# Patient Record
Sex: Male | Born: 1950 | Marital: Single | Smoking: Current every day smoker
Health system: Southern US, Community
[De-identification: ages and names within clinical notes are randomized; demographics above are authoritative.]

## PROBLEM LIST (undated history)

## (undated) DIAGNOSIS — I10 Essential (primary) hypertension: Secondary | ICD-10-CM

## (undated) DIAGNOSIS — G459 Transient cerebral ischemic attack, unspecified: Secondary | ICD-10-CM

---

## 2019-08-23 ENCOUNTER — Other Ambulatory Visit (HOSPITAL_COMMUNITY): Payer: Self-pay | Admitting: Neurology

## 2019-08-23 ENCOUNTER — Other Ambulatory Visit: Payer: Self-pay | Admitting: Neurology

## 2019-08-23 DIAGNOSIS — R413 Other amnesia: Secondary | ICD-10-CM

## 2019-08-23 DIAGNOSIS — G309 Alzheimer's disease, unspecified: Secondary | ICD-10-CM

## 2019-09-07 ENCOUNTER — Ambulatory Visit (HOSPITAL_COMMUNITY): Admission: RE | Admit: 2019-09-07 | Payer: Medicare PPO | Source: Ambulatory Visit

## 2019-09-15 ENCOUNTER — Ambulatory Visit (HOSPITAL_COMMUNITY)
Admission: RE | Admit: 2019-09-15 | Discharge: 2019-09-15 | Disposition: A | Discharge status: Home / Self Care | Payer: Medicare PPO | Source: Ambulatory Visit | Attending: Neurology | Admitting: Neurology

## 2019-09-15 ENCOUNTER — Other Ambulatory Visit: Payer: Self-pay

## 2019-09-15 DIAGNOSIS — F0280 Dementia in other diseases classified elsewhere without behavioral disturbance: Secondary | ICD-10-CM | POA: Insufficient documentation

## 2019-09-15 DIAGNOSIS — R413 Other amnesia: Secondary | ICD-10-CM | POA: Insufficient documentation

## 2019-09-15 DIAGNOSIS — G309 Alzheimer's disease, unspecified: Secondary | ICD-10-CM | POA: Insufficient documentation

## 2019-09-15 DIAGNOSIS — F0150 Vascular dementia without behavioral disturbance: Secondary | ICD-10-CM | POA: Insufficient documentation

## 2019-09-15 IMAGING — MR MR HEAD W/O CM
14 series · 44 of 48 positions shown · non-contrast
Comparison: None.

CLINICAL DATA: Memory loss

EXAM:
MRI HEAD WITHOUT CONTRAST
TECHNIQUE: Multiplanar, multiecho pulse sequences of the brain and surrounding
structures were obtained without intravenous contrast.
Additionally, using NeuroQuant software a 3D volumetric analysis of
the brain was performed and is compared to a normative database
adjusted for age, gender and intracranial volume.

[Series 2: T1 · sagittal · 1.2mm · 0.94mm/px · 5 of 143 slices shown]
[im 1/143]
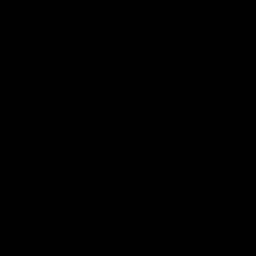
[im 36/143]
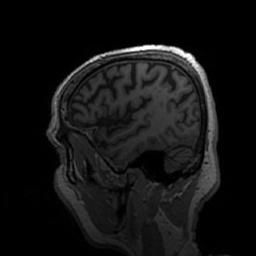
[im 72/143]
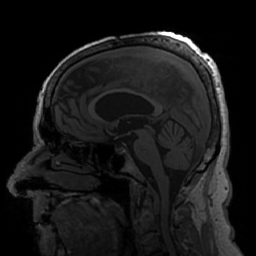
[im 107/143]
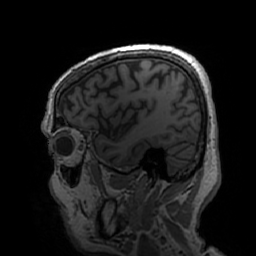
[im 143/143]
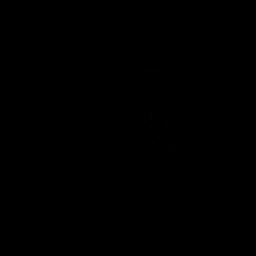

[Series 3: DWI · axial · 3.0mm · 0.94mm/px · z∈[-97,+56]mm · 4 of 104 slices shown (1 of 2)]
[im 1/104]
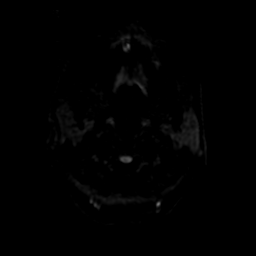
[im 35/104]
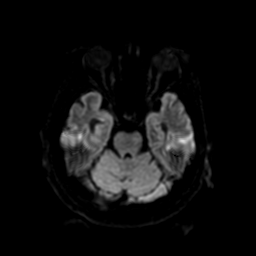
[im 69/104]
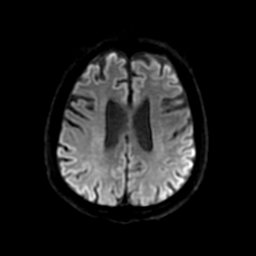
[im 104/104]
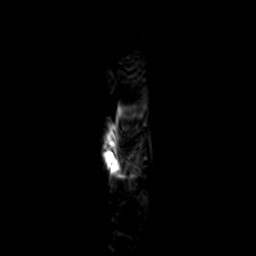

[Series 4: DWI · coronal · 4.0mm · 0.94mm/px · 3 of 74 slices shown (2 of 2)]
[im 1/74]
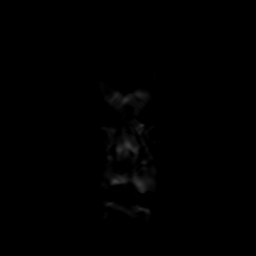
[im 37/74]
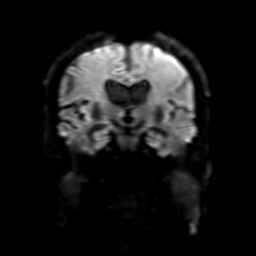
[im 74/74]
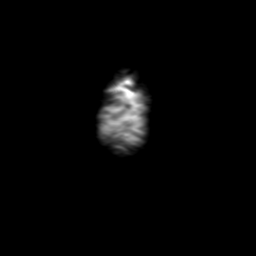

[Series 5: FLAIR · sagittal · 5.0mm · 0.47mm/px · 1 of 25 slices shown (1 of 2)]
[im 1/25]
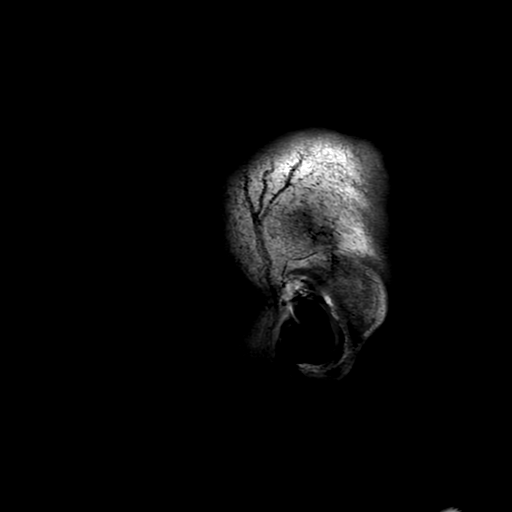

[Series 6: T2 · axial · 5.0mm · 0.47mm/px · 1 of 25 slices shown (1 of 2)]
[im 1/25]
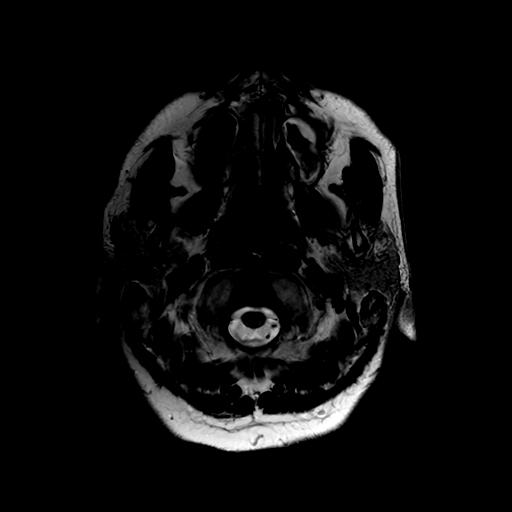

[Series 7: FLAIR · axial · 3.0mm · 0.41mm/px · 1 of 25 slices shown (2 of 2)]
[im 1/25]
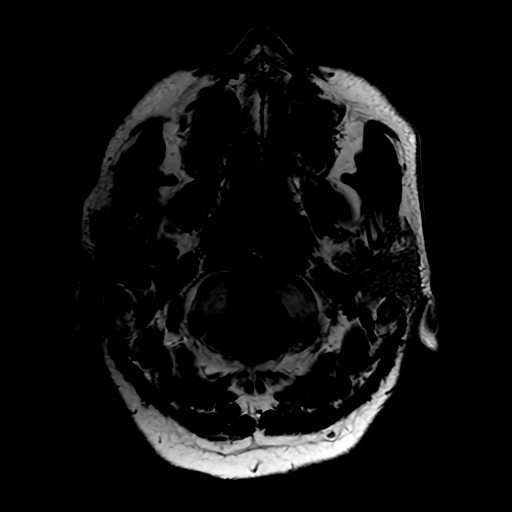

[Series 8: (person_name) · axial · 3.0mm · 0.47mm/px · z∈[-95,+52]mm · 4 of 100 slices shown]
[im 1/100]
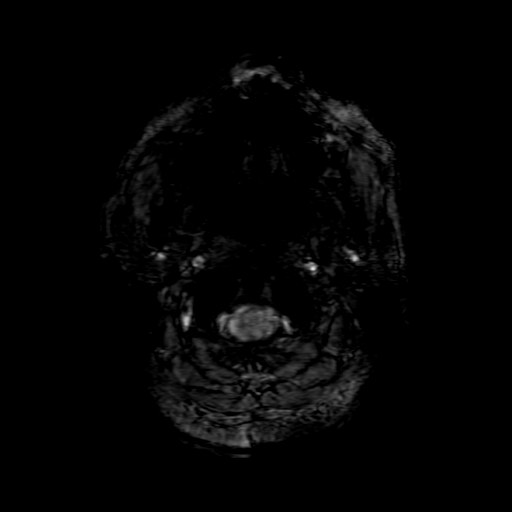
[im 34/100]
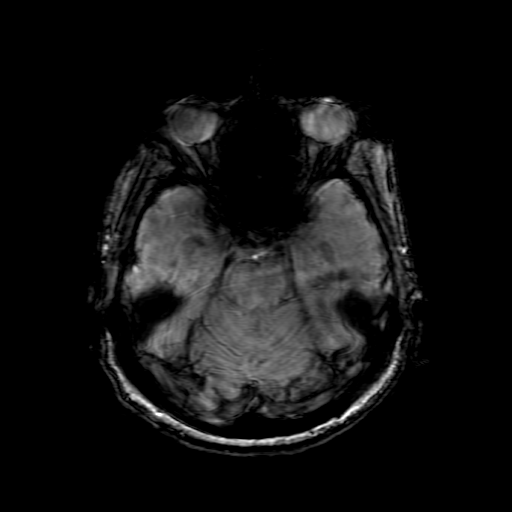
[im 67/100]
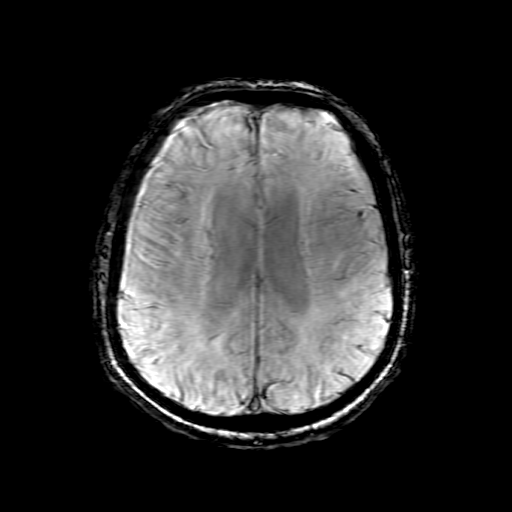
[im 100/100]
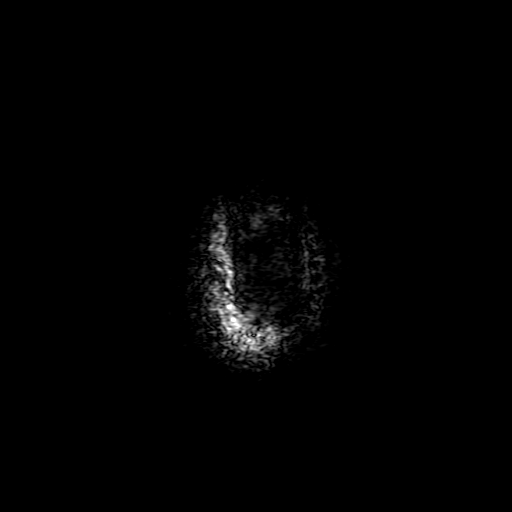

[Series 9: ax 3(person_name) pre · axial · non-contrast · 3.0mm · 0.94mm/px · z∈[-95,+51]mm · 2 of 50 slices shown]
[im 1/50]
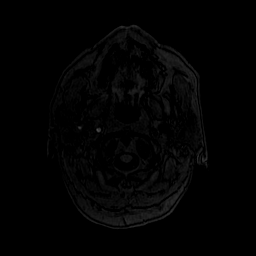
[im 50/50]
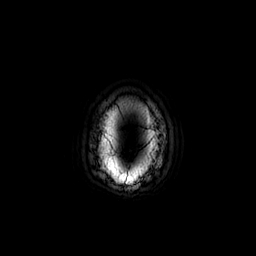

[Series 10: T2 · coronal · 5.0mm · 0.39mm/px · 1 of 30 slices shown (2 of 2)]
[im 1/30]
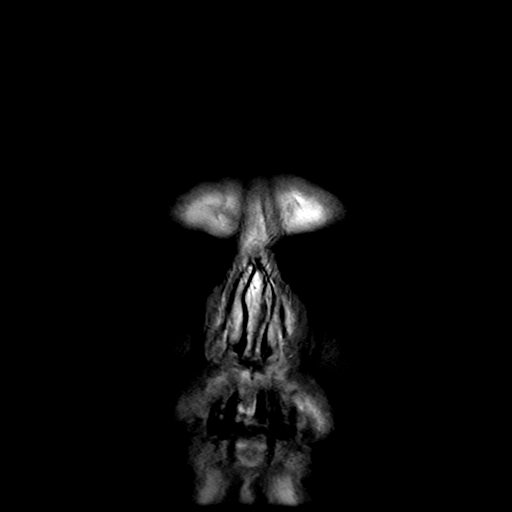

[Series 52: nqsegcb_sc_cor · 1.00mm/px · 8 of 229 slices shown]
[im 1/229]
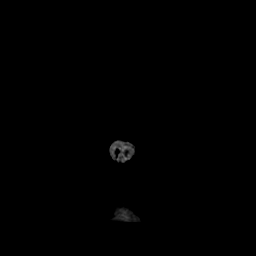
[im 33/229]
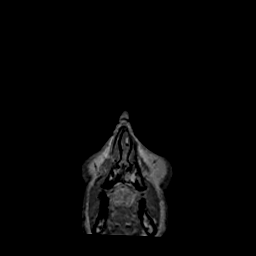
[im 66/229]
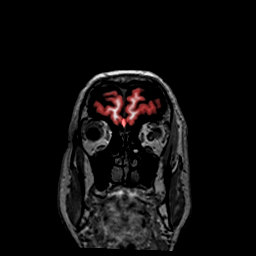
[im 98/229]
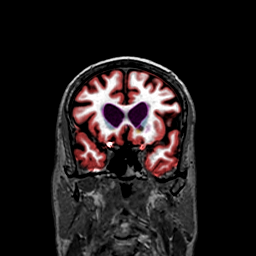
[im 131/229]
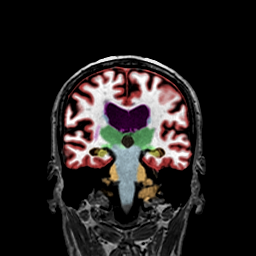
[im 163/229]
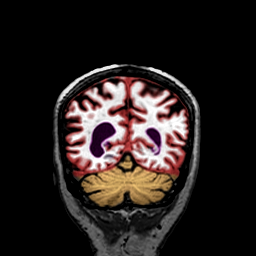
[im 196/229]
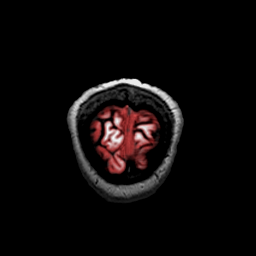
[im 229/229]
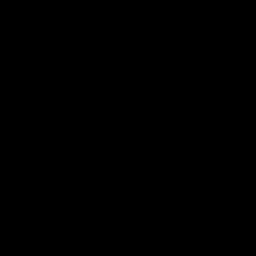

[Series 53: nqsegcb_sc_axl · 1.00mm/px · 8 of 217 slices shown]
[im 1/217]
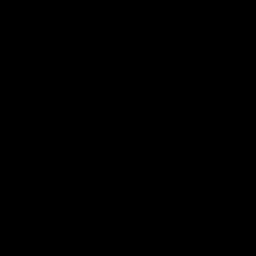
[im 31/217]
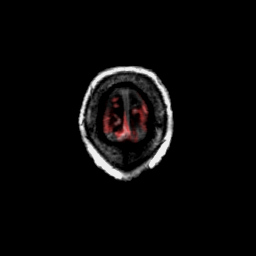
[im 62/217]
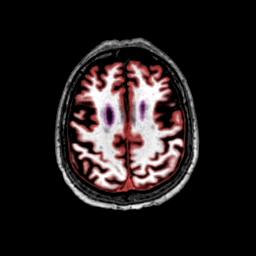
[im 93/217]
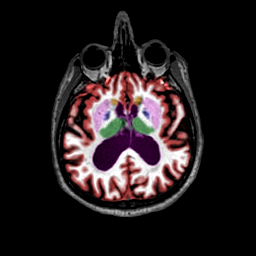
[im 124/217]
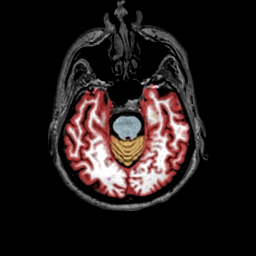
[im 155/217]
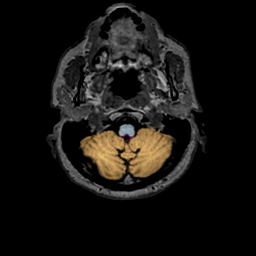
[im 186/217]
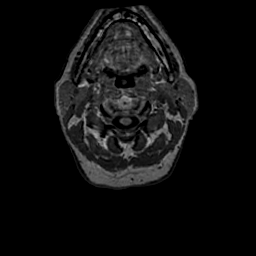
[im 217/217]
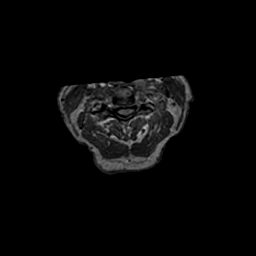

[Series 54: nqsegcb_sc_sag · 1.00mm/px · 3 of 184 slices shown]
[im 1/184]
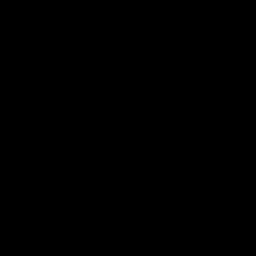
[im 31/184]
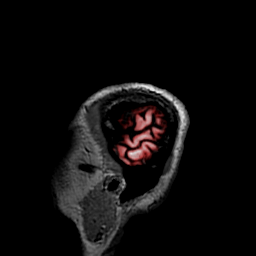
[im 62/184]
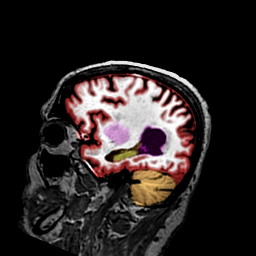

[Series 350: ADC · axial · 3.0mm · 0.94mm/px · z∈[-97,+56]mm · 2 of 52 slices shown (1 of 2)]
[im 1/52]
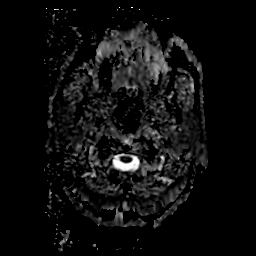
[im 52/52]
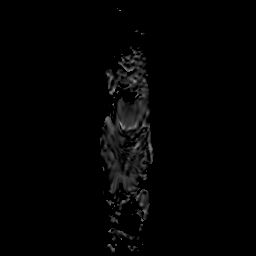

[Series 450: ADC · coronal · 4.0mm · 0.94mm/px · 1 of 37 slices shown (2 of 2)]
[im 1/37]
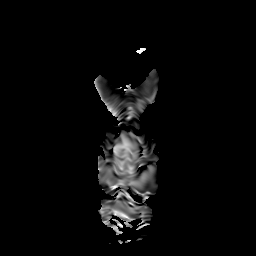

[44 of 48 positions shown; findings below may reference images not displayed]

FINDINGS: NeuroQuant Findings:

Volumetric analysis of the brain was performed, with a fully
detailed report in [HOSPITAL] PACS. Briefly, the comparison with age and
gender matched reference reveals age accelerated volume loss.

BRAIN: No acute infarct, acute hemorrhage or extra-axial collection.
Multifocal white matter hyperintensity, most commonly due to chronic
ischemic microangiopathy. There are old small vessel infarcts of the
basal ganglia bilaterally. Advanced atrophy for age. No chronic
microhemorrhage. Normal midline structures.

VASCULAR: Major flow voids are preserved.

SKULL AND UPPER CERVICAL SPINE: Normal calvarium and skull base.
Visualized upper cervical spine and soft tissues are normal.

SINUSES/ORBITS: No paranasal sinus fluid levels or advanced mucosal
thickening. No mastoid or middle ear effusion. Normal orbits.
IMPRESSION: 1. No acute intracranial abnormality.
2. Generalized atrophy greater than expected for age.
3. Old small vessel infarcts of the basal ganglia bilaterally.
4. NeuroQuant volumetric analysis of the brain, see details on
[HOSPITAL] PACS.

## 2021-02-07 ENCOUNTER — Other Ambulatory Visit: Payer: Self-pay

## 2021-02-07 ENCOUNTER — Emergency Department
Admission: EM | Admit: 2021-02-07 | Discharge: 2021-02-07 | Disposition: A | Discharge status: Other Acute Inpatient Hospital | Payer: Medicare PPO | Attending: Emergency Medicine | Admitting: Emergency Medicine

## 2021-02-07 ENCOUNTER — Emergency Department (HOSPITAL_COMMUNITY): Payer: Medicare PPO

## 2021-02-07 ENCOUNTER — Emergency Department: Payer: Medicare PPO

## 2021-02-07 ENCOUNTER — Encounter: Payer: Self-pay | Admitting: Radiology

## 2021-02-07 ENCOUNTER — Encounter (HOSPITAL_COMMUNITY): Payer: Self-pay

## 2021-02-07 ENCOUNTER — Emergency Department
Admission: EM | Admit: 2021-02-07 | Discharge: 2021-02-07 | Discharge status: Absent without leave | Payer: Medicare PPO | Source: Home / Self Care

## 2021-02-07 ENCOUNTER — Observation Stay (HOSPITAL_COMMUNITY)
Admission: EM | Admit: 2021-02-07 | Discharge: 2021-02-09 | Disposition: A | Discharge status: Home / Self Care | Payer: Medicare PPO | Attending: Family Medicine | Admitting: Family Medicine

## 2021-02-07 DIAGNOSIS — G9340 Encephalopathy, unspecified: Secondary | ICD-10-CM | POA: Diagnosis present

## 2021-02-07 DIAGNOSIS — R4182 Altered mental status, unspecified: Secondary | ICD-10-CM

## 2021-02-07 DIAGNOSIS — R4701 Aphasia: Secondary | ICD-10-CM

## 2021-02-07 DIAGNOSIS — I639 Cerebral infarction, unspecified: Secondary | ICD-10-CM

## 2021-02-07 DIAGNOSIS — Z7901 Long term (current) use of anticoagulants: Secondary | ICD-10-CM | POA: Insufficient documentation

## 2021-02-07 DIAGNOSIS — I69951 Hemiplegia and hemiparesis following unspecified cerebrovascular disease affecting right dominant side: Secondary | ICD-10-CM | POA: Diagnosis not present

## 2021-02-07 DIAGNOSIS — I161 Hypertensive emergency: Secondary | ICD-10-CM

## 2021-02-07 DIAGNOSIS — Z20822 Contact with and (suspected) exposure to covid-19: Secondary | ICD-10-CM | POA: Insufficient documentation

## 2021-02-07 DIAGNOSIS — Z720 Tobacco use: Secondary | ICD-10-CM

## 2021-02-07 DIAGNOSIS — Z7982 Long term (current) use of aspirin: Secondary | ICD-10-CM | POA: Insufficient documentation

## 2021-02-07 DIAGNOSIS — I10 Essential (primary) hypertension: Secondary | ICD-10-CM | POA: Insufficient documentation

## 2021-02-07 DIAGNOSIS — I6522 Occlusion and stenosis of left carotid artery: Secondary | ICD-10-CM

## 2021-02-07 DIAGNOSIS — I69320 Aphasia following cerebral infarction: Secondary | ICD-10-CM | POA: Diagnosis not present

## 2021-02-07 DIAGNOSIS — F17210 Nicotine dependence, cigarettes, uncomplicated: Secondary | ICD-10-CM | POA: Diagnosis not present

## 2021-02-07 DIAGNOSIS — Z7902 Long term (current) use of antithrombotics/antiplatelets: Secondary | ICD-10-CM | POA: Insufficient documentation

## 2021-02-07 DIAGNOSIS — F03918 Unspecified dementia, unspecified severity, with other behavioral disturbance: Secondary | ICD-10-CM

## 2021-02-07 DIAGNOSIS — I6523 Occlusion and stenosis of bilateral carotid arteries: Secondary | ICD-10-CM | POA: Insufficient documentation

## 2021-02-07 DIAGNOSIS — I6381 Other cerebral infarction due to occlusion or stenosis of small artery: Secondary | ICD-10-CM | POA: Diagnosis not present

## 2021-02-07 DIAGNOSIS — R2681 Unsteadiness on feet: Secondary | ICD-10-CM | POA: Diagnosis not present

## 2021-02-07 DIAGNOSIS — Z131 Encounter for screening for diabetes mellitus: Secondary | ICD-10-CM | POA: Diagnosis not present

## 2021-02-07 DIAGNOSIS — Z8673 Personal history of transient ischemic attack (TIA), and cerebral infarction without residual deficits: Secondary | ICD-10-CM | POA: Insufficient documentation

## 2021-02-07 DIAGNOSIS — F0280 Dementia in other diseases classified elsewhere without behavioral disturbance: Secondary | ICD-10-CM

## 2021-02-07 DIAGNOSIS — G300 Alzheimer's disease with early onset: Secondary | ICD-10-CM

## 2021-02-07 DIAGNOSIS — R410 Disorientation, unspecified: Secondary | ICD-10-CM | POA: Diagnosis present

## 2021-02-07 DIAGNOSIS — G459 Transient cerebral ischemic attack, unspecified: Secondary | ICD-10-CM

## 2021-02-07 HISTORY — DX: Essential (primary) hypertension: I10

## 2021-02-07 HISTORY — DX: Transient cerebral ischemic attack, unspecified: G45.9

## 2021-02-07 LAB — PROTIME-INR
INR: 0.9 (ref 0.8–1.2)
INR: 0.9 (ref 0.8–1.2)
Prothrombin Time: 12.6 seconds (ref 11.4–15.2)
Prothrombin Time: 12.2 seconds (ref 11.4–15.2)

## 2021-02-07 LAB — CBC WITH DIFFERENTIAL/PLATELET
Abs Immature Granulocytes: 0.02 10*3/uL (ref 0.00–0.07)
Basophils Absolute: 0 10*3/uL (ref 0.0–0.1)
Basophils Relative: 1 %
Eosinophils Absolute: 0 10*3/uL (ref 0.0–0.5)
Eosinophils Relative: 0 %
HCT: 48.1 % (ref 39.0–52.0)
Hemoglobin: 16.9 g/dL (ref 13.0–17.0)
Immature Granulocytes: 0 %
Lymphocytes Relative: 12 %
Lymphs Abs: 0.9 10*3/uL (ref 0.7–4.0)
MCH: 32.3 pg (ref 26.0–34.0)
MCHC: 35.1 g/dL (ref 30.0–36.0)
MCV: 92 fL (ref 80.0–100.0)
Monocytes Absolute: 0.4 10*3/uL (ref 0.1–1.0)
Monocytes Relative: 5 %
Neutro Abs: 6.6 10*3/uL (ref 1.7–7.7)
Neutrophils Relative %: 82 %
Platelets: 260 10*3/uL (ref 150–400)
RBC: 5.23 MIL/uL (ref 4.22–5.81)
RDW: 11.9 % (ref 11.5–15.5)
WBC: 8 10*3/uL (ref 4.0–10.5)
nRBC: 0 % (ref 0.0–0.2)

## 2021-02-07 LAB — URINALYSIS, ROUTINE W REFLEX MICROSCOPIC
Bilirubin Urine: NEGATIVE
Glucose, UA: NEGATIVE mg/dL
Ketones, ur: NEGATIVE mg/dL
Leukocytes,Ua: NEGATIVE
Nitrite: NEGATIVE
Protein, ur: NEGATIVE mg/dL
Specific Gravity, Urine: 1.02 (ref 1.005–1.030)
pH: 6.5 (ref 5.0–8.0)

## 2021-02-07 LAB — DIFFERENTIAL
Abs Immature Granulocytes: 0.02 10*3/uL (ref 0.00–0.07)
Basophils Absolute: 0.1 10*3/uL (ref 0.0–0.1)
Basophils Relative: 1 %
Eosinophils Absolute: 0.1 10*3/uL (ref 0.0–0.5)
Eosinophils Relative: 1 %
Immature Granulocytes: 0 %
Lymphocytes Relative: 15 %
Lymphs Abs: 0.9 10*3/uL (ref 0.7–4.0)
Monocytes Absolute: 0.4 10*3/uL (ref 0.1–1.0)
Monocytes Relative: 7 %
Neutro Abs: 4.3 10*3/uL (ref 1.7–7.7)
Neutrophils Relative %: 76 %

## 2021-02-07 LAB — COMPREHENSIVE METABOLIC PANEL
ALT: 10 U/L (ref 0–44)
ALT: 13 U/L (ref 0–44)
AST: 17 U/L (ref 15–41)
AST: 16 U/L (ref 15–41)
Albumin: 3.8 g/dL (ref 3.5–5.0)
Albumin: 3.7 g/dL (ref 3.5–5.0)
Alkaline Phosphatase: 104 U/L (ref 38–126)
Alkaline Phosphatase: 97 U/L (ref 38–126)
Anion gap: 9 (ref 5–15)
Anion gap: 13 (ref 5–15)
BUN: 10 mg/dL (ref 8–23)
BUN: 8 mg/dL (ref 8–23)
CO2: 26 mmol/L (ref 22–32)
CO2: 23 mmol/L (ref 22–32)
Calcium: 9.4 mg/dL (ref 8.9–10.3)
Calcium: 9.2 mg/dL (ref 8.9–10.3)
Chloride: 99 mmol/L (ref 98–111)
Chloride: 99 mmol/L (ref 98–111)
Creatinine, Ser: 0.88 mg/dL (ref 0.61–1.24)
Creatinine, Ser: 0.97 mg/dL (ref 0.61–1.24)
GFR, Estimated: >60 mL/min (ref >60)
GFR, Estimated: >60 mL/min (ref >60)
Glucose, Bld: 102 mg/dL — ABNORMAL HIGH (ref 70–99)
Glucose, Bld: 103 mg/dL — ABNORMAL HIGH (ref 70–99)
Potassium: 4.2 mmol/L (ref 3.5–5.1)
Potassium: 3.8 mmol/L (ref 3.5–5.1)
Sodium: 134 mmol/L — ABNORMAL LOW (ref 135–145)
Sodium: 135 mmol/L (ref 135–145)
Total Bilirubin: 1.7 mg/dL — ABNORMAL HIGH (ref 0.3–1.2)
Total Bilirubin: 1.4 mg/dL — ABNORMAL HIGH (ref 0.3–1.2)
Total Protein: 7.3 g/dL (ref 6.5–8.1)
Total Protein: 7.1 g/dL (ref 6.5–8.1)

## 2021-02-07 LAB — CBC
HCT: 48 % (ref 39.0–52.0)
Hemoglobin: 17.1 g/dL — ABNORMAL HIGH (ref 13.0–17.0)
MCH: 32.9 pg (ref 26.0–34.0)
MCHC: 35.6 g/dL (ref 30.0–36.0)
MCV: 92.3 fL (ref 80.0–100.0)
Platelets: 229 10*3/uL (ref 150–400)
RBC: 5.2 MIL/uL (ref 4.22–5.81)
RDW: 11.9 % (ref 11.5–15.5)
WBC: 5.7 10*3/uL (ref 4.0–10.5)
nRBC: 0 % (ref 0.0–0.2)

## 2021-02-07 LAB — APTT
aPTT: 30 seconds (ref 24–36)
aPTT: 26 seconds (ref 24–36)

## 2021-02-07 LAB — HEMOGLOBIN A1C
Hgb A1c MFr Bld: 5.2 % (ref 4.8–5.6)
Mean Plasma Glucose: 102.54 mg/dL

## 2021-02-07 LAB — RESP PANEL BY RT-PCR (FLU A&B, COVID) ARPGX2
Influenza A by PCR: NEGATIVE
Influenza A by PCR: NEGATIVE
Influenza B by PCR: NEGATIVE
Influenza B by PCR: NEGATIVE
SARS Coronavirus 2 by RT PCR: NEGATIVE
SARS Coronavirus 2 by RT PCR: NEGATIVE

## 2021-02-07 LAB — RAPID URINE DRUG SCREEN, HOSP PERFORMED
Amphetamines: NOT DETECTED
Barbiturates: NOT DETECTED
Benzodiazepines: NOT DETECTED
Cocaine: NOT DETECTED
Opiates: NOT DETECTED
Tetrahydrocannabinol: NOT DETECTED

## 2021-02-07 LAB — URINALYSIS, MICROSCOPIC (REFLEX)

## 2021-02-07 LAB — I-STAT CHEM 8, ED
BUN: 9 mg/dL (ref 8–23)
Calcium, Ion: 1.08 mmol/L — ABNORMAL LOW (ref 1.15–1.40)
Chloride: 100 mmol/L (ref 98–111)
Creatinine, Ser: 0.8 mg/dL (ref 0.61–1.24)
Glucose, Bld: 96 mg/dL (ref 70–99)
HCT: 49 % (ref 39.0–52.0)
Hemoglobin: 16.7 g/dL (ref 13.0–17.0)
Potassium: 3.7 mmol/L (ref 3.5–5.1)
Sodium: 135 mmol/L (ref 135–145)
TCO2: 26 mmol/L (ref 22–32)

## 2021-02-07 LAB — LIPID PANEL
Cholesterol: 294 mg/dL — ABNORMAL HIGH (ref 0–200)
HDL: 58 mg/dL (ref >40)
LDL Cholesterol: 213 mg/dL — ABNORMAL HIGH (ref 0–99)
Total CHOL/HDL Ratio: 5.1 RATIO
Triglycerides: 113 mg/dL (ref <150)
VLDL: 23 mg/dL (ref 0–40)

## 2021-02-07 LAB — T4, FREE: Free T4: 0.95 ng/dL (ref 0.61–1.12)

## 2021-02-07 LAB — ETHANOL: Alcohol, Ethyl (B): <10 mg/dL (ref <10)

## 2021-02-07 LAB — TSH: TSH: 4.077 u[IU]/mL (ref 0.350–4.500)

## 2021-02-07 IMAGING — CT CT ANGIO HEAD-NECK (W OR W/O PERF)
2 of 7 series · 8 of 33 positions shown · IV contrast (APPLIED)
Comparison: None.

CLINICAL DATA: Neuro deficit, acute, stroke suspected

EXAM:
CT ANGIOGRAPHY HEAD AND NECK
TECHNIQUE: Multidetector CT imaging of the head and neck was performed using
the standard protocol during bolus administration of intravenous
contrast. Multiplanar CT image reconstructions and MIPs were
obtained to evaluate the vascular anatomy. Carotid stenosis
measurements (when applicable) are obtained utilizing NASCET
criteria, using the distal internal carotid diameter as the
denominator.

[Series 4: cta head neck · axial · 0.61mm/px · z∈[-258,-138]mm · 2 of 181 slices shown]
[im 61/181  soft-tissue]
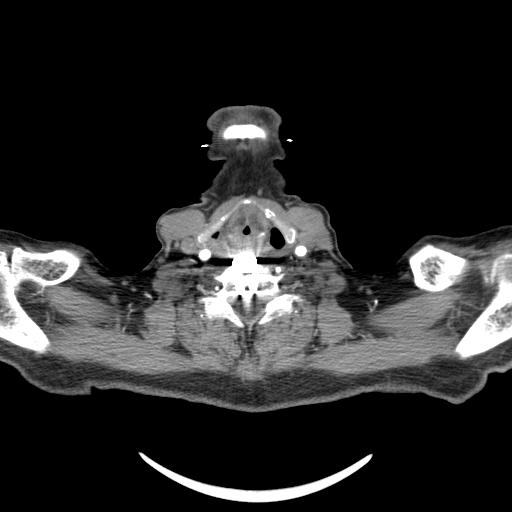
[im 121/181  soft-tissue]
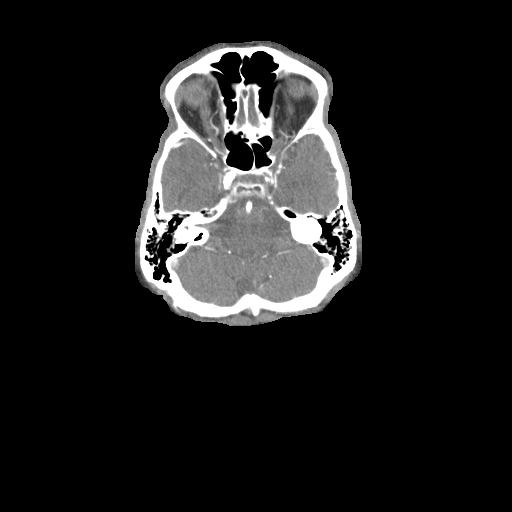

[Series 6: ax thin · axial · 0.46mm/px · z∈[-356,-93]mm · 6 of 373 slices shown]
[im 54/373  soft-tissue]
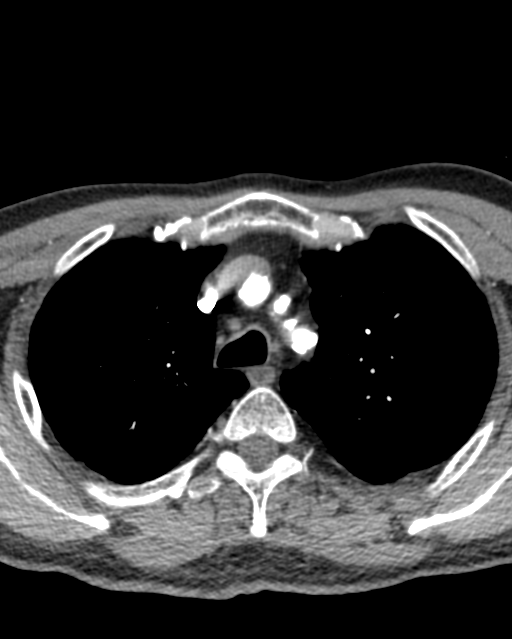
[im 107/373  bone]
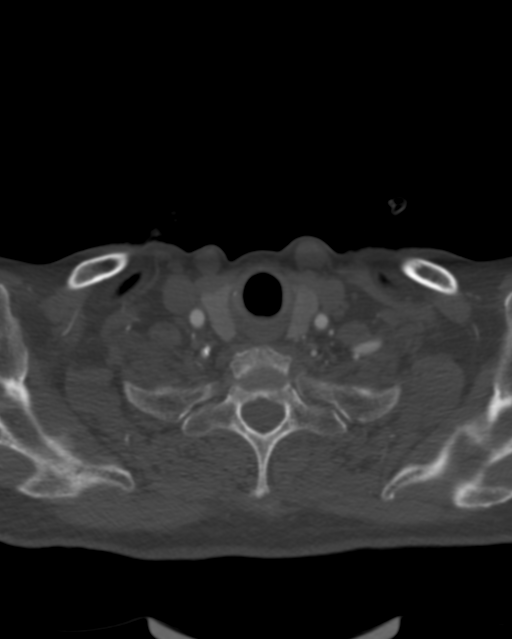
[im 160/373  soft-tissue]
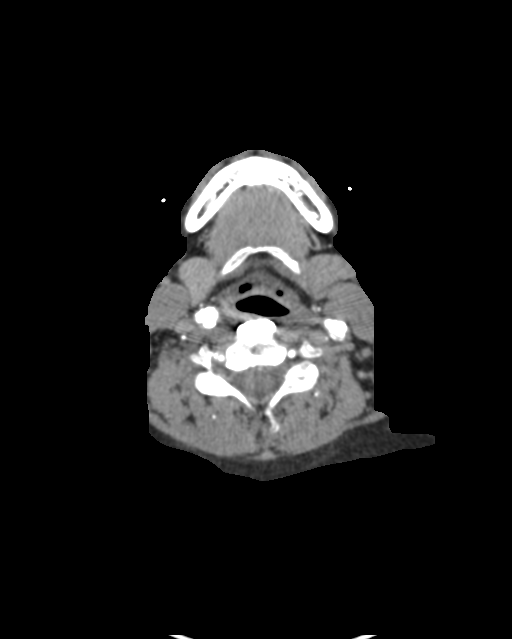
[im 213/373  bone]
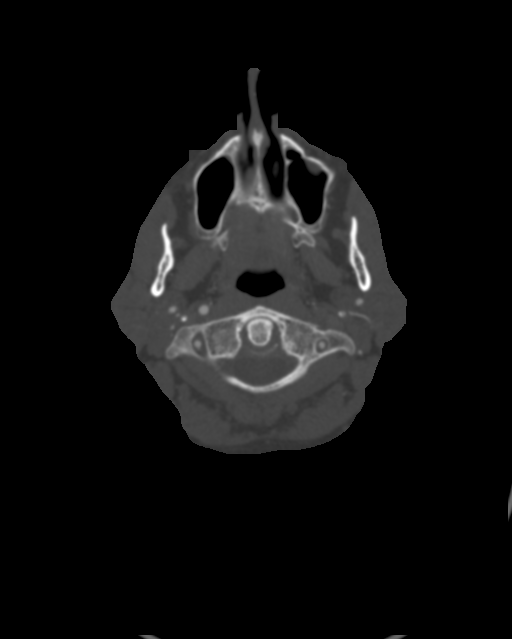
[im 266/373  soft-tissue]
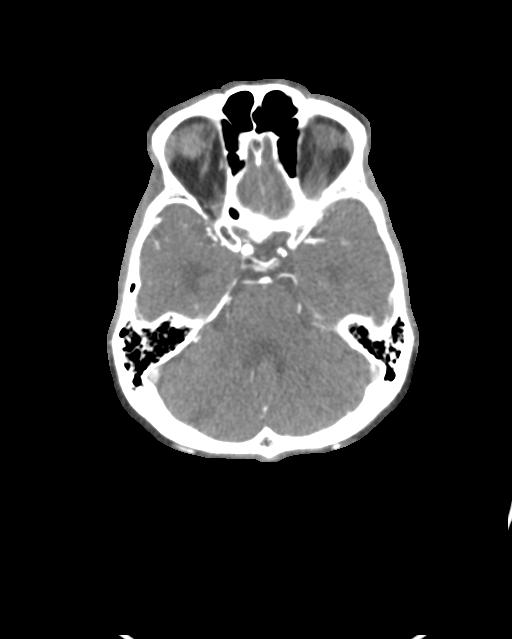
[im 319/373  bone]
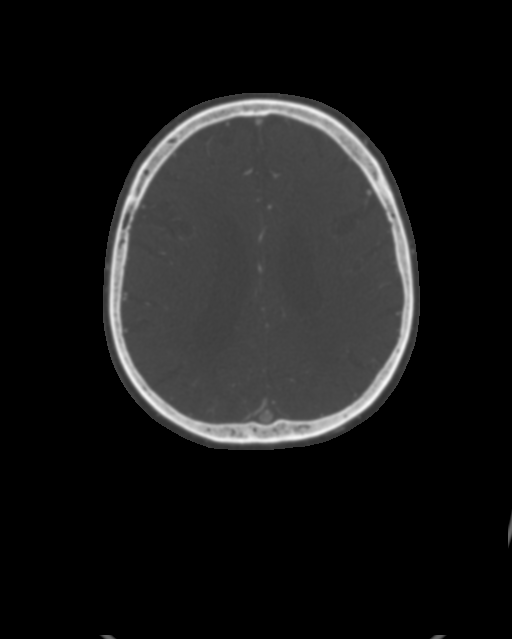

[8 of 33 positions shown; findings below may reference images not displayed]

RADIATION DOSE REDUCTION: This exam was performed according to the
departmental dose-optimization program which includes automated
exposure control, adjustment of the mA and/or kV according to
patient size and/or use of iterative reconstruction technique.

CONTRAST:  75mL OMNIPAQUE IOHEXOL 350 MG/ML SOLN
FINDINGS: CTA NECK

Aortic arch: Calcified plaque along the arch and patent great vessel
origins. No high-grade proximal subclavian stenosis.

Right carotid system: Patent. There is eccentric primarily
noncalcified plaque along the mid common carotid with less than 50%
stenosis. Primarily calcified plaque is present at the distal common
carotid and proximal internal carotid with less than 50% stenosis.

Left carotid system: Common carotid is patent with mixed plaque
causing less than 50% stenosis. There is mixed plaque at the ICA
origin with occlusion. No reconstitution in the neck.

Vertebral arteries: Patent. Mixed plaque along the right V1
vertebral artery with marked stenosis. Focal marked stenosis the
C3-C4 level. The left vertebral artery arises directly from the arch
where there is plaque causing marked stenosis.

Skeleton: Prior ACDF C4-C7.  Cervical spine degenerative changes.

Other neck: Unremarkable.

Upper chest: Emphysema.

Review of the MIP images confirms the above findings

CTA HEAD

Anterior circulation: There is partial reconstitution of the left
internal carotid beginning at the level of the clinoid. Intracranial
right internal carotid artery is patent with calcified plaque
causing mild stenosis. Anterior cerebral arteries are patent.
Anterior communicating artery is present. Middle cerebral arteries
are patent. Mild stenosis of the proximal right M1 MCA.

Posterior circulation: Intracranial vertebral arteries are patent.
Basilar artery is patent. Major cerebellar artery origins are
patent. Posterior cerebral arteries are patent with atherosclerotic
irregularity. There is moderate stenosis of the distal left P1 and
distal to proximal right P1-P2 segments. Additional moderate
stenosis of the distal left P2 segment.

Venous sinuses: Patent as allowed by contrast bolus timing.

Review of the MIP images confirms the above findings
IMPRESSION: Age-indeterminate occlusion of the left ICA at its origin. Partial
reconstitution intracranially at the level of the clinoid.

Plaque along the right common and internal carotid arteries causing
less than 50% stenosis.

Extracranial vertebral artery atherosclerosis. Plaque along the
proximal vertebral arteries with marked stenosis. Additional focal
marked stenosis on the right at the C3-C4 level.

Mild stenosis proximal right M1 MCA. Moderate stenoses of the PCAs
bilaterally.

Preliminary results were communicated to Dr. BUMKYU at [DATE] on
[DATE] by text page via the [employer] messaging system.

## 2021-02-07 IMAGING — CT CT HEAD CODE STROKE
5 of 6 series · 17 of 47 positions shown, 18 images · non-contrast
Comparison: None.

CLINICAL DATA: Code stroke.  Neuro deficit, acute, stroke suspected



[Series 3: head wo · axial · 0.41mm/px · z∈[-60,-15]mm · 2 of 29 slices shown, 3 images (1 of 2)]
[im 10/29  brain]
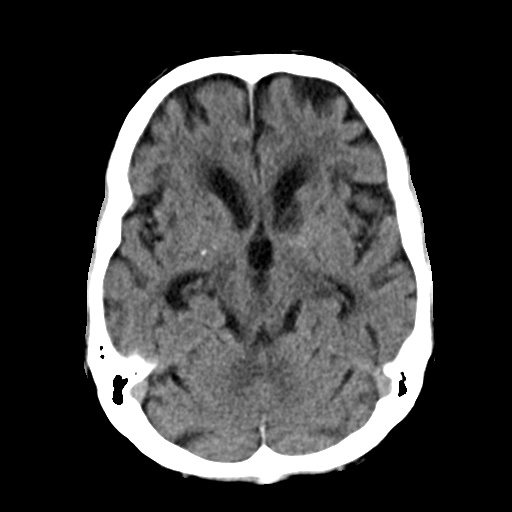
[im 10/29  bone]
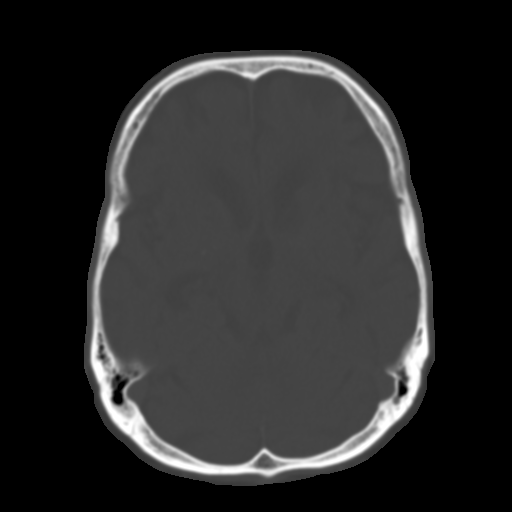
[im 19/29  brain]
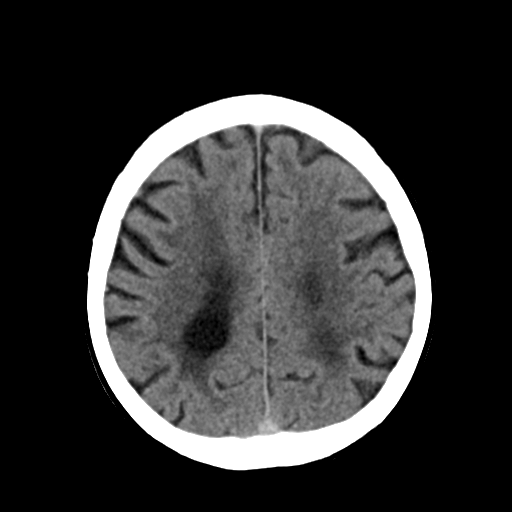

[Series 4: head bone · axial · 0.41mm/px · z∈[-91,+7]mm · 7 of 72 slices shown]
[im 8/72  bone]
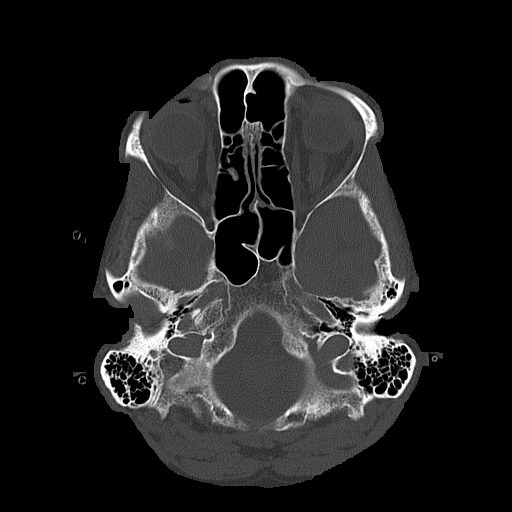
[im 15/72  bone]
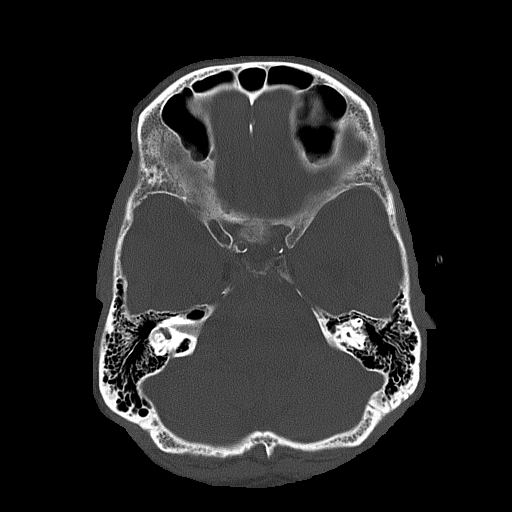
[im 22/72  bone]
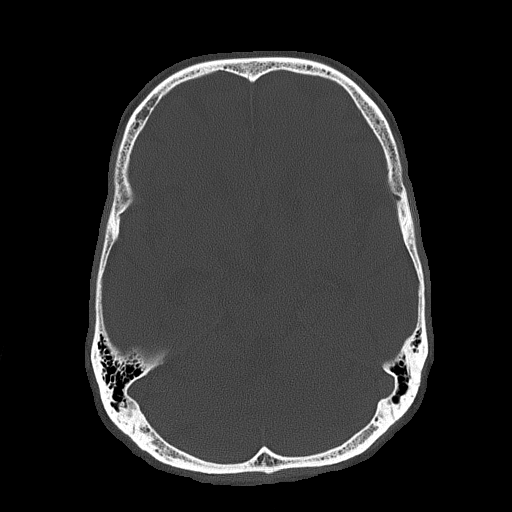
[im 29/72  bone]
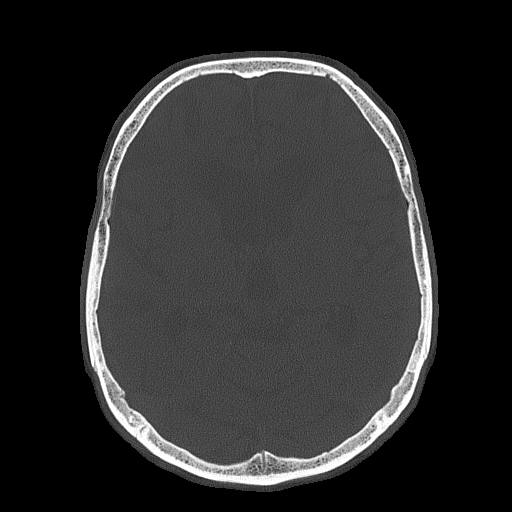
[im 43/72  bone]
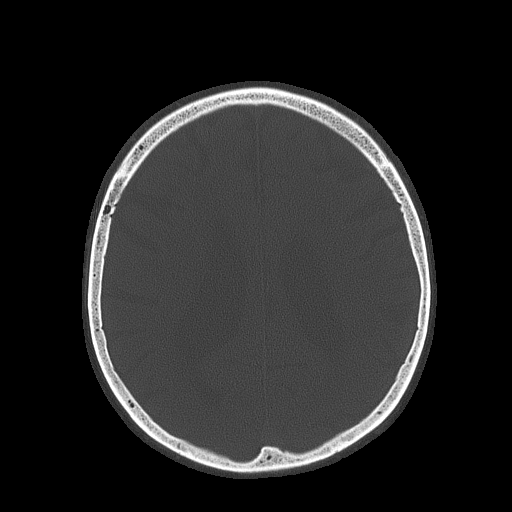
[im 50/72  bone]
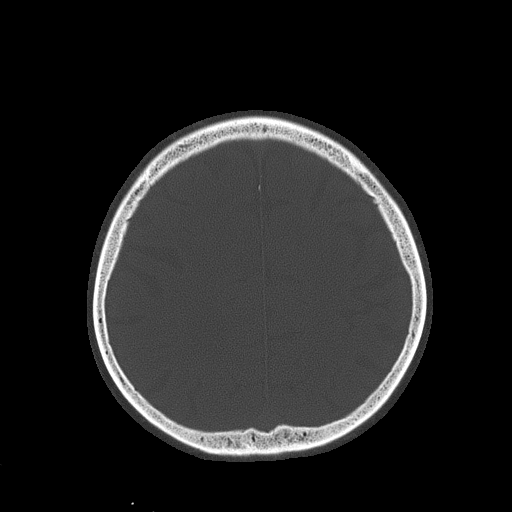
[im 57/72  bone]
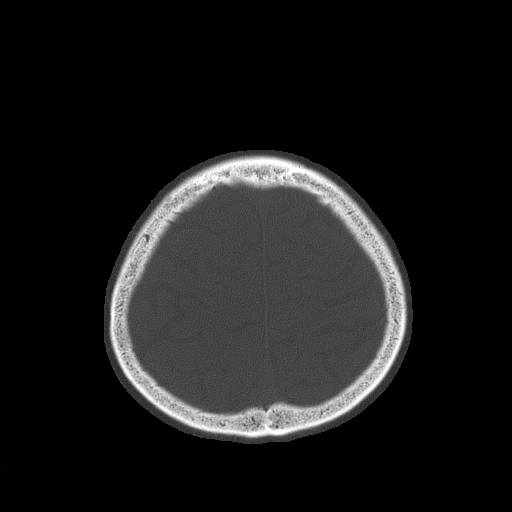

[Series 5: coronal soft tissue · coronal · 0.32mm/px · 3 of 62 slices shown]
[im 21/62  brain]
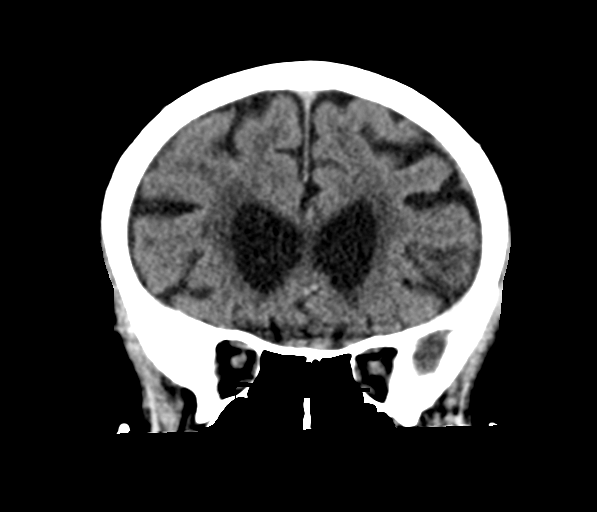
[im 28/62  brain]
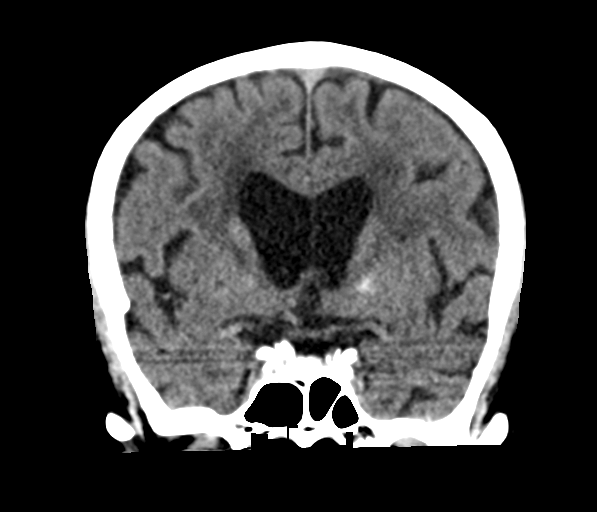
[im 34/62  brain]
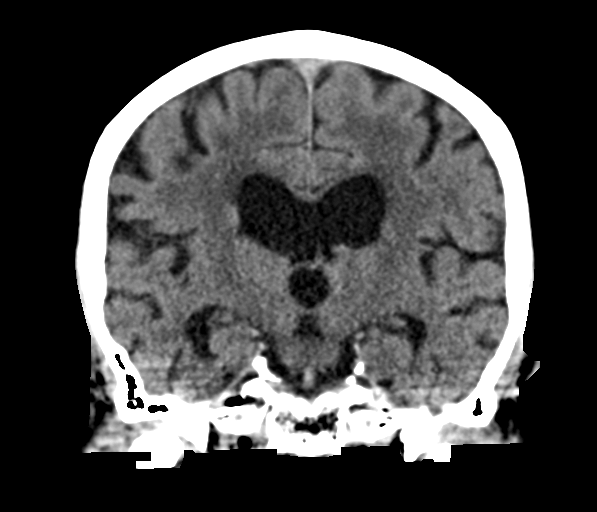

[Series 6: sagittal soft tissue · sagittal · 0.32mm/px · 3 of 56 slices shown]
[im 19/56  brain]
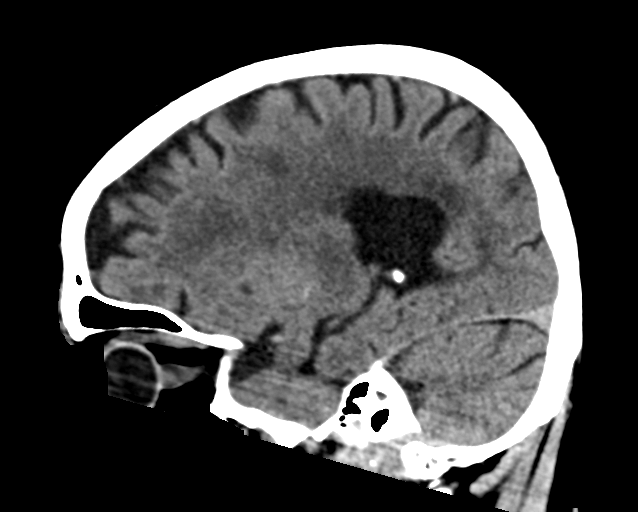
[im 28/56  brain]
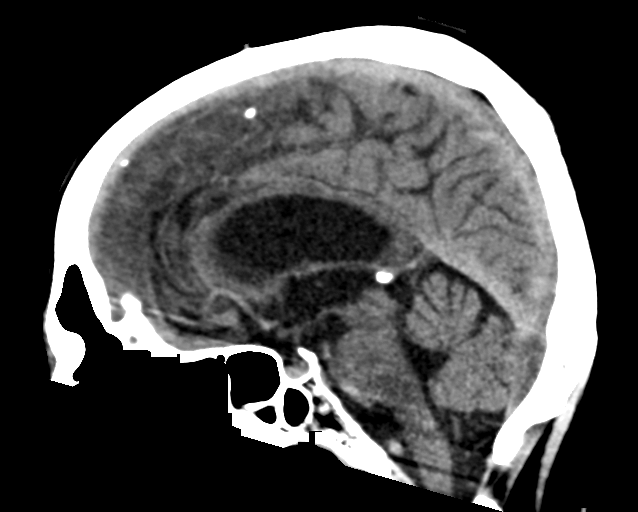
[im 37/56  brain]
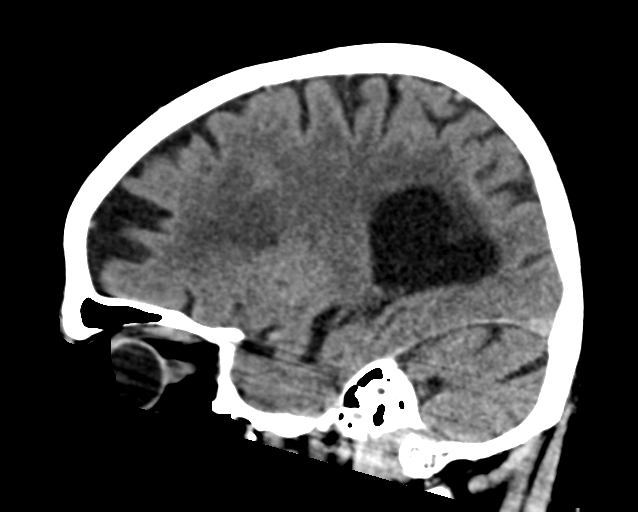

[Series 7: head wo · axial · 0.41mm/px · z∈[-60,-15]mm · 2 of 29 slices shown (2 of 2)]
[im 10/29  brain]
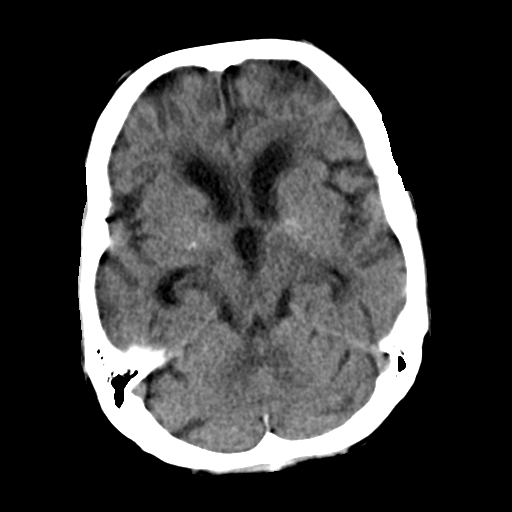
[im 19/29  brain]
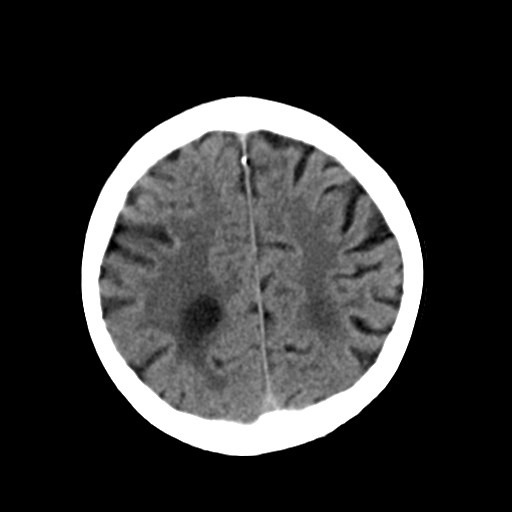

[17 of 47 positions shown; findings below may reference images not displayed]

FINDINGS: Brain: There is no acute intracranial hemorrhage, mass effect, or
edema. Gray-white differentiation is preserved. Patchy and confluent
areas of hypoattenuation in the supratentorial white matter are
nonspecific but probably reflect moderate chronic microvascular
ischemic changes. There are chronic appearing small vessel infarcts
of the right caudate and thalamus. Prominence of the ventricles and
sulci reflects parenchymal volume loss.

Vascular: No hyperdense vessel. There is intracranial
atherosclerotic calcification at the skull base.

Skull: Unremarkable.

Sinuses/Orbits: No acute abnormality.

Other: Mastoid air cells are clear.

ASPECTS ([place]berta Stroke Program Early CT Score)

- Ganglionic level infarction (caudate, lentiform nuclei, internal
capsule, insula, M1-M3 cortex): 7

- Supraganglionic infarction (M4-M6 cortex): 3

Total score (0-10 with 10 being normal): 10
IMPRESSION: There is no acute intracranial hemorrhage or evidence of acute
infarction. ASPECT score is 10.

Chronic microvascular ischemic changes. Chronic appearing small
vessel infarcts of right caudate and thalamus.

Preliminary results were communicated to Dr. JORDON at [DATE] on
[DATE] by text page via the [employer] messaging system.

## 2021-02-07 IMAGING — MR MR HEAD W/O CM
10 of 12 series · 42 of 48 positions shown · non-contrast
Comparison: Head CT [DATE]

CLINICAL DATA: Difficulty speaking and altered mental status

EXAM:
MRI HEAD WITHOUT CONTRAST
TECHNIQUE: Multiplanar, multiecho pulse sequences of the brain and surrounding
structures were obtained without intravenous contrast.

[Series 5: DWI · axial · 3.0mm · 0.88mm/px · z∈[-83,+64]mm · 9 of 100 slices shown (1 of 4)]
[im 1/100]
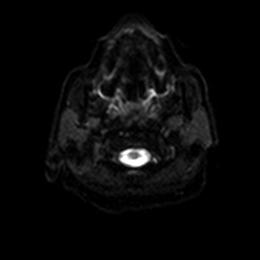
[im 13/100]
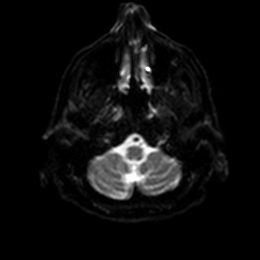
[im 25/100]
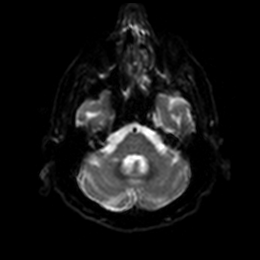
[im 38/100]
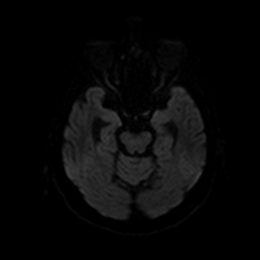
[im 50/100]
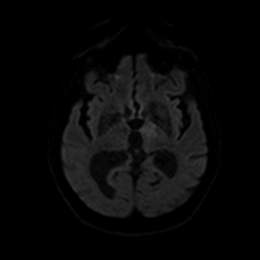
[im 62/100]
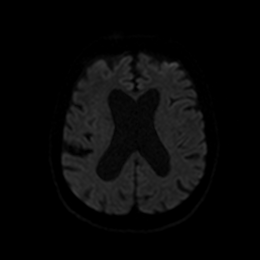
[im 75/100]
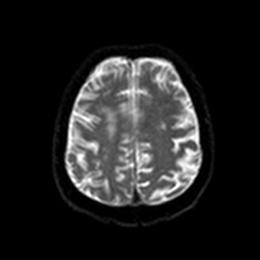
[im 87/100]
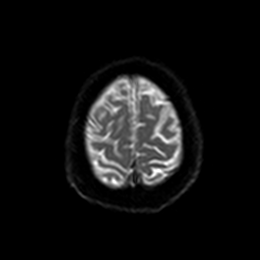
[im 100/100]
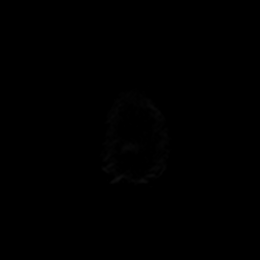

[Series 6: DWI · axial · 3.0mm · 0.88mm/px · z∈[-83,+64]mm · 5 of 50 slices shown (2 of 4)]
[im 1/50]
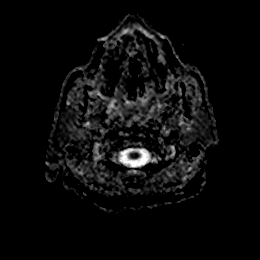
[im 13/50]
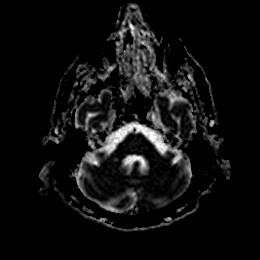
[im 25/50]
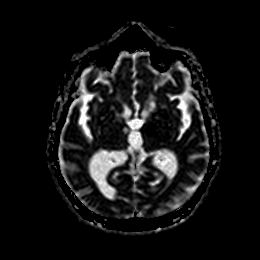
[im 37/50]
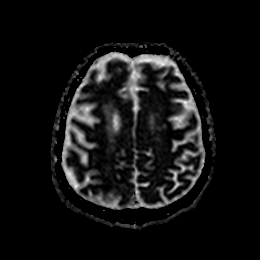
[im 50/50]
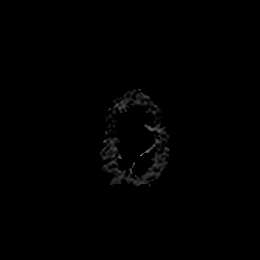

[Series 7: DWI · coronal · 4.0mm · 0.88mm/px · 6 of 66 slices shown (3 of 4)]
[im 1/66]
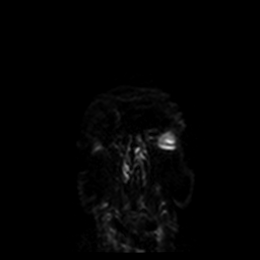
[im 14/66]
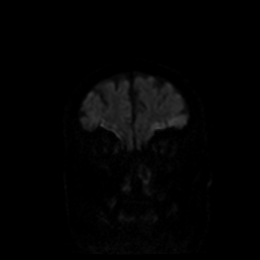
[im 27/66]
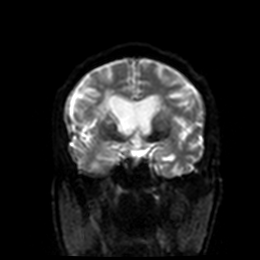
[im 40/66]
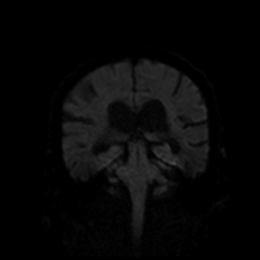
[im 53/66]
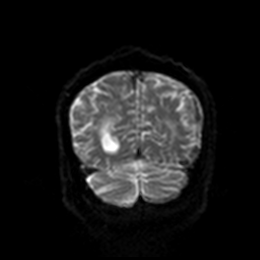
[im 66/66]
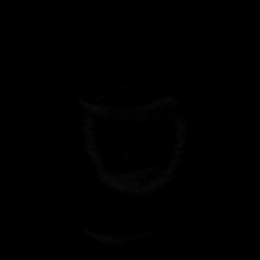

[Series 8: DWI · coronal · 4.0mm · 0.88mm/px · 3 of 33 slices shown (4 of 4)]
[im 1/33]
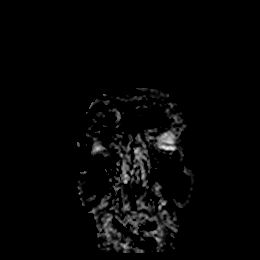
[im 17/33]
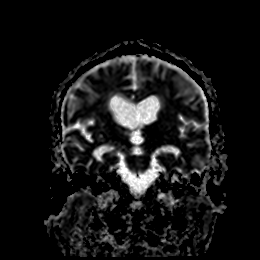
[im 33/33]
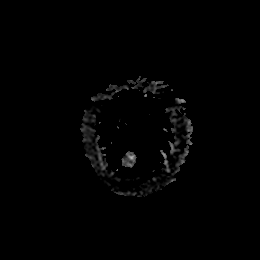

[Series 9: T1 · sagittal · 5.0mm · 0.78mm/px · 2 of 23 slices shown]
[im 1/23]
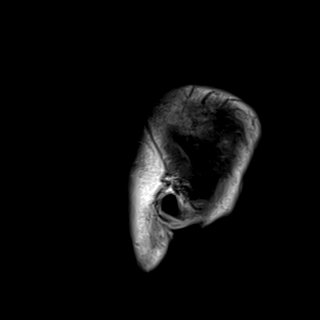
[im 23/23]
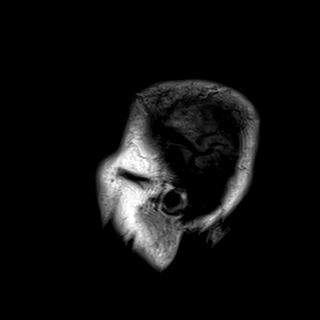

[Series 11: pha_images · axial · 3.0mm · 0.90mm/px · z∈[-94,+80]mm · 5 of 56 slices shown]
[im 1/56]
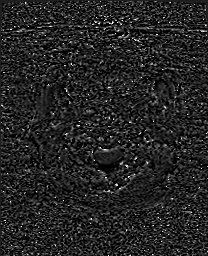
[im 14/56]
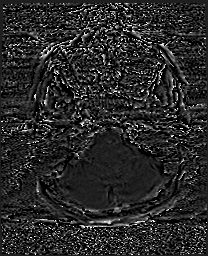
[im 28/56]
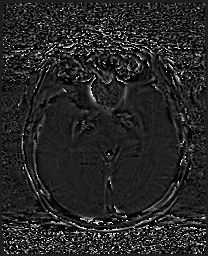
[im 42/56]
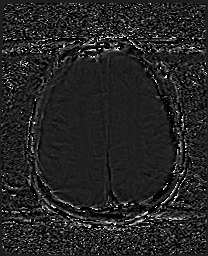
[im 56/56]
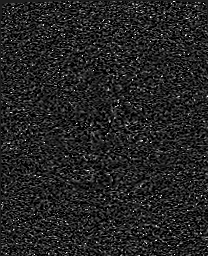

[Series 12: swi_images · axial · 3.0mm · 0.90mm/px · z∈[-97,+80]mm · 5 of 60 slices shown]
[im 1/60]
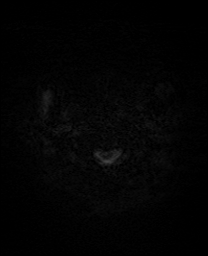
[im 15/60]
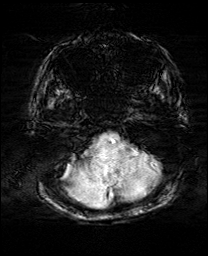
[im 30/60]
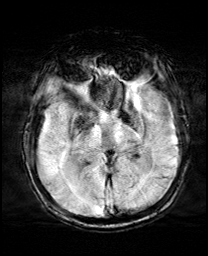
[im 45/60]
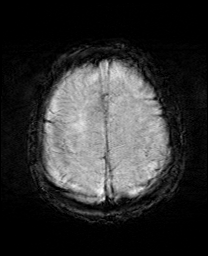
[im 60/60]
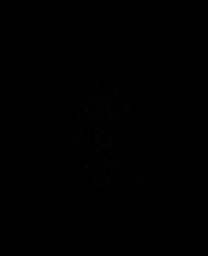

[Series 14: FLAIR · axial · 5.0mm · 0.45mm/px · z∈[-80,+64]mm · 2 of 25 slices shown]
[im 1/25]
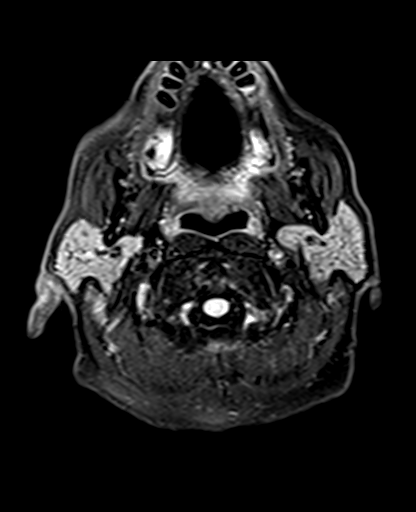
[im 25/25]
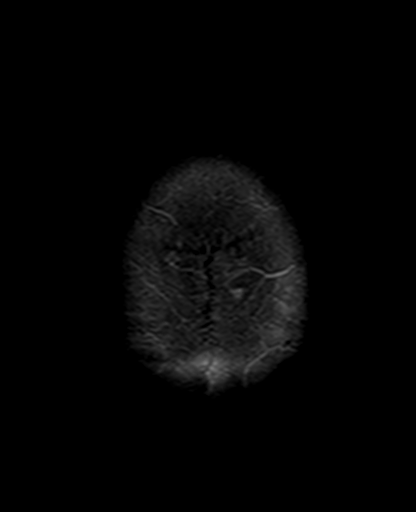

[Series 15: T2 · axial · 5.0mm · 0.72mm/px · z∈[-81,+63]mm · 2 of 25 slices shown (1 of 2)]
[im 1/25]
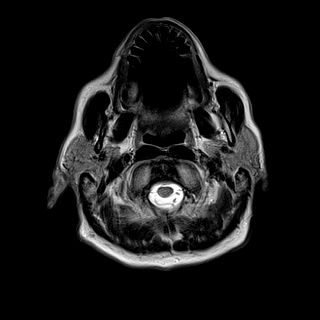
[im 25/25]
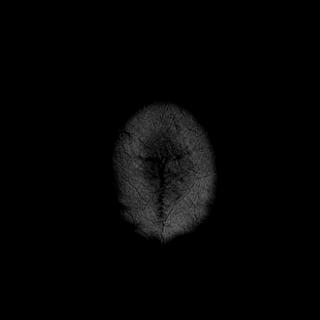

[Series 17: T2 · coronal · 5.0mm · 0.34mm/px · 3 of 29 slices shown (2 of 2)]
[im 1/29]
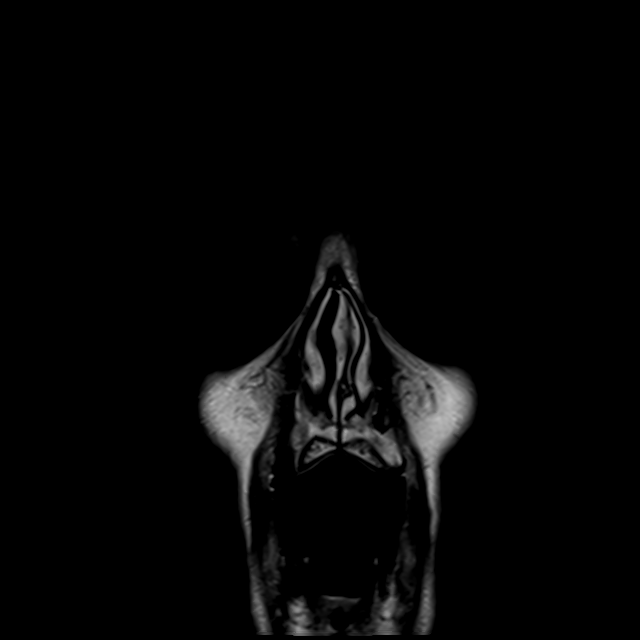
[im 15/29]
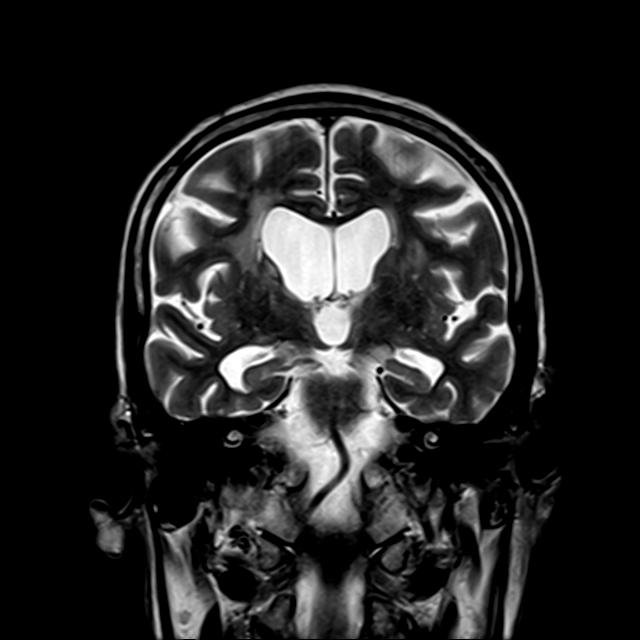
[im 29/29]
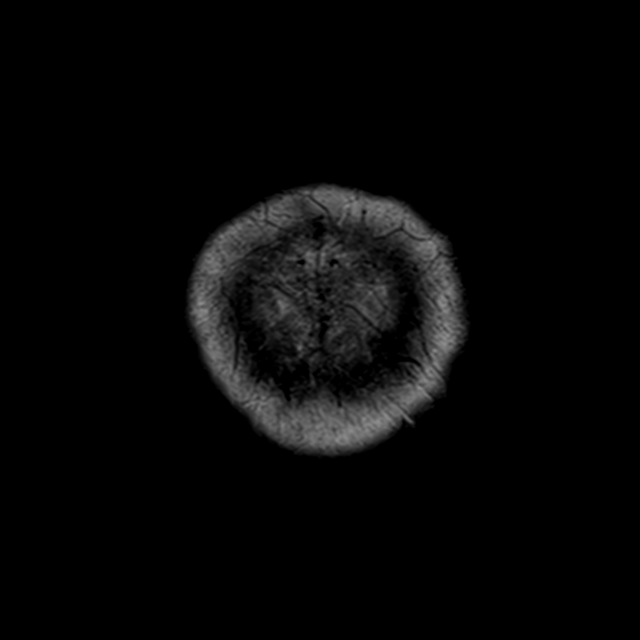

[42 of 48 positions shown; findings below may reference images not displayed]

FINDINGS: Brain: There is a small area of subacute ischemia in the left
thalamus. No acute or chronic hemorrhage. Hyperintense T2-weighted
signal is moderately widespread throughout the white matter.
Advanced atrophy for age. There are multiple old small vessel
infarcts of the basal ganglia and cerebellum. The midline structures
are normal.

Vascular: Major flow voids are preserved.

Skull and upper cervical spine: Normal calvarium and skull base.
Visualized upper cervical spine and soft tissues are normal.

Sinuses/Orbits:No paranasal sinus fluid levels or advanced mucosal
thickening. No mastoid or middle ear effusion. Normal orbits.
IMPRESSION: 1. Small area of subacute ischemia in the left thalamus. No
hemorrhage or mass effect.
2. Advanced atrophy and chronic ischemic microangiopathy.

## 2021-02-07 MED ORDER — ACETAMINOPHEN 650 MG RE SUPP: 650.0000 mg | Freq: Four times a day (QID) | RECTAL | Status: DC | PRN

## 2021-02-07 MED ORDER — ASPIRIN 81 MG PO CHEW
324.0000 mg | CHEWABLE_TABLET | ORAL | Status: AC
  Administered 2021-02-07: 324 mg via ORAL

## 2021-02-07 MED ORDER — CLOPIDOGREL BISULFATE 75 MG PO TABS
300.0000 mg | ORAL_TABLET | ORAL | Status: AC
  Administered 2021-02-07: 300 mg via ORAL
  Filled 2021-02-07: qty 4

## 2021-02-07 MED ORDER — ACETAMINOPHEN 325 MG PO TABS: 650.0000 mg | ORAL_TABLET | Freq: Four times a day (QID) | ORAL | Status: DC | PRN

## 2021-02-07 MED ORDER — IOHEXOL 350 MG/ML SOLN
40.0000 mL | Freq: Once | INTRAVENOUS | Status: AC | PRN
  Administered 2021-02-07: 40 mL via INTRAVENOUS

## 2021-02-07 MED ORDER — LABETALOL HCL 5 MG/ML IV SOLN: 10.0000 mg | Freq: Once | INTRAVENOUS | Status: AC

## 2021-02-07 MED ORDER — CLEVIDIPINE BUTYRATE 0.5 MG/ML IV EMUL: 0.0000 mg/h | INTRAVENOUS | Status: DC

## 2021-02-07 MED ORDER — IOHEXOL 350 MG/ML SOLN
75.0000 mL | Freq: Once | INTRAVENOUS | Status: AC | PRN
  Administered 2021-02-07: 75 mL via INTRAVENOUS

## 2021-02-07 MED ORDER — SODIUM CHLORIDE 0.9% FLUSH
3.0000 mL | Freq: Once | INTRAVENOUS | Status: AC
  Administered 2021-02-07: 3 mL via INTRAVENOUS

## 2021-02-07 MED ORDER — LABETALOL HCL 5 MG/ML IV SOLN
INTRAVENOUS | Status: AC
  Administered 2021-02-07: 10 mg via INTRAVENOUS
  Filled 2021-02-07: qty 4

## 2021-02-07 MED ORDER — LORAZEPAM 2 MG/ML IJ SOLN
0.5000 mg | Freq: Once | INTRAMUSCULAR | Status: AC
  Administered 2021-02-07: 0.5 mg via INTRAVENOUS
  Filled 2021-02-07: qty 1

## 2021-02-07 MED ORDER — ASPIRIN 81 MG PO CHEW
CHEWABLE_TABLET | ORAL | Status: AC
  Filled 2021-02-07: qty 4

## 2021-02-07 MED ORDER — STROKE: EARLY STAGES OF RECOVERY BOOK
Freq: Once | Status: AC
  Filled 2021-02-07 (×2): qty 1

## 2021-02-07 NOTE — ED Notes (Signed)
Informed EDP about BP. This nurse informed by stroke coordinator that pt will transfer to [facility] ED to ED.

## 2021-02-07 NOTE — ED Notes (Signed)
ED Provider at bedside. 

## 2021-02-07 NOTE — ED Notes (Signed)
Called [facility] spoke to [place] for transfer  [phone]

## 2021-02-07 NOTE — ED Notes (Signed)
MRI tech notified that patient is now calm and cooperative /resting ,received Ativan 0.5 mg , ready for MRI .

## 2021-02-07 NOTE — Code Documentation (Signed)
Stroke Response Nurse Documentation Code Documentation  Paul Fitzgerald. is a 71 y.o. male arriving to [facility] ED via [facility] on 02/07/2021 with past medical hx of HTN and afib. Per family on no anticoagulants. Code stroke was activated by EMS.   Patient from home where he was LKW at [phone] and now complaining of difficulty speaking and altered mental status. Pt was hypertensive on arrival (201/118). CBG checked on arrival and was 76.    Stroke team at the bedside on patient arrival. Labs drawn and patient cleared for CT by Dr. Tamala Julian. Patient to CT with team. NIHSS 4, see documentation for details and code stroke times. CT, CTA head and neck completed. Patient is not a candidate for IV Thrombolytic due to LKW >4.5hrs.    Bedside handoff with ED RN Elie Goody.    Velta Addison Stroke Designer, fashion/clothing

## 2021-02-07 NOTE — ED Triage Notes (Signed)
From [place], [place] today, NIH 4 with dysarthria and AMS, oriented to person, BP >200, last 191/96.

## 2021-02-07 NOTE — ED Notes (Signed)
Patient transported to MRI 

## 2021-02-07 NOTE — Progress Notes (Signed)
CODE STROKE- PHARMACY COMMUNICATION   Time CODE STROKE called/page received: 1223 (LKW was 0815)  Time response to CODE STROKE was made (in person or via phone): Immediately  Time Stroke Kit retrieved from [employer]: N/A, no tPA per neurologist  Name of Provider contacted: Dr. Arnette Schaumann  02/07/2021  1:20 PM

## 2021-02-07 NOTE — ED Provider Notes (Signed)
[facility] EMERGENCY DEPARTMENT Provider Note   CSN: [id number] Arrival date & time: 02/07/21  1558     History  Chief Complaint  Patient presents with   Altered Mental Status    Paul Fitzgerald. is a 71 y.o. male.  Pt is a 71 yo male with pmh of previous CVA presenting for stroke like symptoms. Patient's last known well 0800 today. States patient was found by daughter with dysarthria and confusion. Denies previous illness, fever, chills, or coughing. Denies UTI symptoms. Denies alcohol use. Denies recent falls or head trauma.   The history is provided by the patient. No language interpreter was used.  Altered Mental Status Associated symptoms: no abdominal pain, no fever, no palpitations, no rash, no seizures, no vomiting and no weakness       Home Medications Prior to Admission medications   Not on File      Allergies    Patient has no known allergies.    Review of Systems   Review of Systems  Constitutional:  Negative for chills and fever.  HENT:  Negative for ear pain and sore throat.   Eyes:  Negative for pain and visual disturbance.  Respiratory:  Negative for cough and shortness of breath.   Cardiovascular:  Negative for chest pain and palpitations.  Gastrointestinal:  Negative for abdominal pain and vomiting.  Genitourinary:  Negative for dysuria and hematuria.  Musculoskeletal:  Negative for arthralgias and back pain.  Skin:  Negative for color change and rash.  Neurological:  Positive for speech difficulty. Negative for seizures, syncope, facial asymmetry and weakness.  All other systems reviewed and are negative.  Physical Exam Updated Vital Signs BP (!) 204/102    Pulse 95    Temp 98.5 F (36.9 C) (Oral)    Resp (!) 22    SpO2 100%  Physical Exam Vitals and nursing note reviewed.  Constitutional:      General: He is not in acute distress.    Appearance: He is well-developed.  HENT:     Head: Normocephalic and atraumatic.  Eyes:      General: Lids are normal. Vision grossly intact. No visual field deficit.    Conjunctiva/sclera: Conjunctivae normal.     Pupils: Pupils are equal, round, and reactive to light.     Visual Fields: Right eye visual fields normal and left eye visual fields normal.  Cardiovascular:     Rate and Rhythm: Normal rate and regular rhythm.     Heart sounds: No murmur heard. Pulmonary:     Effort: Pulmonary effort is normal. No respiratory distress.     Breath sounds: Normal breath sounds.  Abdominal:     Palpations: Abdomen is soft.     Tenderness: There is no abdominal tenderness.  Musculoskeletal:        General: No swelling.     Cervical back: Neck supple.  Skin:    General: Skin is warm and dry.     Capillary Refill: Capillary refill takes less than 2 seconds.  Neurological:     Mental Status: He is alert. He is confused.     GCS: GCS eye subscore is 4. GCS verbal subscore is 5. GCS motor subscore is 6.     Cranial Nerves: No cranial nerve deficit, dysarthria or facial asymmetry.     Sensory: Sensation is intact.     Motor: Motor function is intact.     Coordination: Coordination is intact.  Psychiatric:  Mood and Affect: Mood normal.    ED Results / Procedures / Treatments   Labs (all labs ordered are listed, but only abnormal results are displayed) Labs Reviewed - No data to display  EKG None  Radiology CT CEREBRAL PERFUSION W CONTRAST  Result Date: 02/07/2021 CLINICAL DATA:  Neuro deficit, acute, stroke suspected EXAM: CT PERFUSION BRAIN TECHNIQUE: Multiphase CT imaging of the brain was performed following IV bolus contrast injection. Subsequent parametric perfusion maps were calculated using RAPID software. RADIATION DOSE REDUCTION: This exam was performed according to the departmental dose-optimization program which includes automated exposure control, adjustment of the mA and/or kV according to patient size and/or use of iterative reconstruction technique.  CONTRAST:  66mL OMNIPAQUE IOHEXOL 350 MG/ML SOLN COMPARISON:  None. FINDINGS: Some motion artifact is present at later time points. CBF (<30%) Volume: 43mL Perfusion (Tmax>6.0s) volume: 49mL Mismatch Volume: 56mL ASPECTS on noncontrast CT Head: 10 at 12:42 p.m. today. Infarct Core: 0 mL Infarction Location: None. IMPRESSION: No evidence of core infarction or territory at risk. Electronically Signed   By: Macy Mis M.D.   On: 02/07/2021 14:20   CT HEAD CODE STROKE WO CONTRAST  Result Date: 02/07/2021 CLINICAL DATA:  Code stroke.  Neuro deficit, acute, stroke suspected EXAM: CT HEAD WITHOUT CONTRAST TECHNIQUE: Contiguous axial images were obtained from the base of the skull through the vertex without intravenous contrast. RADIATION DOSE REDUCTION: This exam was performed according to the departmental dose-optimization program which includes automated exposure control, adjustment of the mA and/or kV according to patient size and/or use of iterative reconstruction technique. COMPARISON:  None. FINDINGS: Brain: There is no acute intracranial hemorrhage, mass effect, or edema. Tyri Elmore-white differentiation is preserved. Patchy and confluent areas of hypoattenuation in the supratentorial white matter are nonspecific but probably reflect moderate chronic microvascular ischemic changes. There are chronic appearing small vessel infarcts of the right caudate and thalamus. Prominence of the ventricles and sulci reflects parenchymal volume loss. Vascular: No hyperdense vessel. There is intracranial atherosclerotic calcification at the skull base. Skull: Unremarkable. Sinuses/Orbits: No acute abnormality. Other: Mastoid air cells are clear. ASPECTS ([place] Stroke Program Early CT Score) - Ganglionic level infarction (caudate, lentiform nuclei, internal capsule, insula, M1-M3 cortex): 7 - Supraganglionic infarction (M4-M6 cortex): 3 Total score (0-10 with 10 being normal): 10 IMPRESSION: There is no acute intracranial  hemorrhage or evidence of acute infarction. ASPECT score is 10. Chronic microvascular ischemic changes. 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 Preliminary results were communicated to Dr. Theda Sers at 12:46 pm on 02/07/2021 by text page via the [facility] messaging system. Electronically Signed   By: Macy Mis M.D.   On: 02/07/2021 12:48   CT ANGIO HEAD NECK W WO CM (CODE STROKE)  Result Date: 02/07/2021 CLINICAL DATA:  Neuro deficit, acute, stroke suspected EXAM: CT ANGIOGRAPHY HEAD AND NECK TECHNIQUE: Multidetector CT imaging of the head and neck was performed using the standard protocol during bolus administration of intravenous contrast. Multiplanar CT image reconstructions and MIPs were obtained to evaluate the vascular anatomy. Carotid stenosis measurements (when applicable) are obtained utilizing NASCET criteria, using the distal internal carotid diameter as the denominator. RADIATION DOSE REDUCTION: This exam was performed according to the departmental dose-optimization program which includes automated exposure control, adjustment of the mA and/or kV according to patient size and/or use of iterative reconstruction technique. CONTRAST:  70mL OMNIPAQUE IOHEXOL 350 MG/ML SOLN COMPARISON:  None. FINDINGS: CTA NECK Aortic arch: Calcified plaque along the arch and patent great vessel  origins. No high-grade proximal subclavian stenosis. Right carotid system: Patent. There is eccentric primarily noncalcified plaque along the mid common carotid with less than 50% stenosis. Primarily calcified plaque is present at the distal common carotid and proximal internal carotid with less than 50% stenosis. Left carotid system: Common carotid is patent with mixed plaque causing less than 50% stenosis. There is mixed plaque at the ICA origin with occlusion. No reconstitution in the neck. Vertebral arteries: Patent. Mixed plaque along the right V1 vertebral artery with marked stenosis. Focal  marked stenosis the C3-C4 level. The left vertebral artery arises directly from the arch where there is plaque causing marked stenosis. Skeleton: Prior ACDF C4-C7.  Cervical spine degenerative changes. Other neck: Unremarkable. Upper chest: Emphysema. Review of the MIP images confirms the above findings CTA HEAD Anterior circulation: There is partial reconstitution of the left internal carotid beginning at the level of the clinoid. Intracranial right internal carotid artery is patent with calcified plaque causing mild stenosis. Anterior cerebral arteries are patent. Anterior communicating artery is present. Middle cerebral arteries are patent. Mild stenosis of the proximal right M1 MCA. Posterior circulation: Intracranial vertebral arteries are patent. Basilar artery is patent. Major cerebellar artery origins are patent. Posterior cerebral arteries are patent with atherosclerotic irregularity. There is moderate stenosis of the distal left P1 and distal to proximal right P1-P2 segments. Additional moderate stenosis of the distal left P2 segment. Venous sinuses: Patent as allowed by contrast bolus timing. 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 Partial reconstitution intracranially at the level of the clinoid. Plaque along the right common and internal carotid arteries causing less than 50% stenosis. Extracranial vertebral artery atherosclerosis. Plaque along the proximal vertebral arteries with marked stenosis. Additional focal marked stenosis on the right at the C3-C4 level. Mild stenosis proximal right M1 MCA. Moderate stenoses of the PCAs bilaterally. Preliminary results were communicated to Dr. Theda Sers at 1:03 pm on 02/07/2021 by text page via the [employer] [facility] messaging system. Electronically Signed   By: Macy Mis M.D.   On: 02/07/2021 13:19    Procedures .Critical Care Performed by: Lianne Cure, DO Authorized by: Lianne Cure, DO   Critical care provider statement:    Critical care time (minutes):  38   Critical care was necessary to treat or prevent imminent or life-threatening deterioration of the following conditions: stroke eval.   Critical care was time spent personally by me on the following activities:  Development of treatment plan with patient or surrogate, discussions with consultants, evaluation of patient's response to treatment, examination of patient, ordering and review of laboratory studies, ordering and review of radiographic studies, ordering and performing treatments and interventions, pulse oximetry, re-evaluation of patient's condition and review of old charts Comments:     Discussed care and plan with neurology  Admission to hospitalist    Medications Ordered in ED Medications - No data to display  ED Course/ Medical Decision Making/ A&P                           Medical Decision Making  4:31 PM 71 yo male with pmh of previous CVA presenting for stroke like symtpoms. Pt is alert but not oriented, confused, afebrile, with stable vitals. No sensation or motor deficits. No dysarthria. No aphasia.   -Last known well: 0800 today -NIH Stroke scale 1 for lack of orientation/confusion -Patient will not receive  TPA for the following reason(s): Did not meet the time window for tPA. Thorough discussion had with patient and/or family/guardian on risks/benefits of tPA and why patient is not a candidate for thrombolytic therapy at this time. tPA will not be given to this patient. ASA will be administered if there are no contraindications or allergies.  -CT head canceled, MRI quick today. -HTN of 211/117. Goal for permissive HTN of >220/>120 due to risk of acute ischemic stroke. -MRI pending.  Neuro eval and recs: -Permissive htn to 220 -keep maintenance fluids -plav ans ASA load then ASA 81 mg daily, Plavix 75 mg daily  10:57 PM Patient accepted by admitting team.  MRI still  pending.  AMS/confusion -no hypoxia -no hypoglycemia -no anemia or active bleeding -no s/s sepsis -urine pending -stable thyroid function -no new med changes -did not order VBG to eval for hypercarbia. No respiratory distress or solmulence. Hypercarbia unlikely         Final Clinical Impression(s) / ED Diagnoses Final diagnoses:  Altered mental status, unspecified altered mental status type    Rx / DC Orders ED Discharge Orders     None         Lianne Cure, DO [id number]

## 2021-02-07 NOTE — ED Notes (Signed)
Informed CT that pt has 18g IV for CT perfusion.

## 2021-02-07 NOTE — Consult Note (Addendum)
Neurology stroke Consult H&P  Paul Fitzgerald. MR# [id number] 02/07/2021  CC: slurred speech and confusion  History is obtained from: EMS, family and chart.  HPI: Paul Fitzgerald. is a 71 y.o. male left handed PMHx as reviewed below developed confusion and slurred speech at 0815.  He arrives smiling and moving all extremities, however SBP in the 200s.  There are no records and family is not present.  LKW: 0815 tNK given: No OSW IR Thrombectomy No, not candidate Modified Rankin Scale: 0-Completely asymptomatic and back to baseline post- stroke NIHSS: 4 - orientation questions  ROS: Unable to assess due to encephalopathy.  Social History:  has no history on file for tobacco use, alcohol use, and drug use.  Exam: Current vital signs: There were no vitals taken for this visit.  Physical Exam  Constitutional: Appears well-developed and well-nourished.  Psych: Affect appropriate to situation Eyes: No scleral injection HENT: No OP obstruction. Head: Normocephalic.  Cardiovascular: Normal rate and regular rhythm.  Respiratory: Effort normal, symmetric excursions bilaterally, no audible wheezing. GI: Soft.  No distension. There is no tenderness.  Skin: WDI  Neuro: Mental Status: Patient is awake, alert, not oriented year, or situation. Patient is not able to give a clear and coherent history. Speech mild anomia with intact fluency, intact comprehension and repetition. Mild anomic aphasia. No signs of neglect. Visual Fields are full. Pupils are ~29mm, irregular/surgical pupils and reactive to light. EOMI without ptosis or diploplia.  Facial sensation is symmetric to temperature Facial movement is symmetric.  Hearing is intact to voice. Uvula midline and palate elevates symmetrically. Shoulder shrug is symmetric. Tongue is midline without atrophy or fasciculations.  Tone is normal. Bulk is normal. 5/5 strength was present in all four extremities. Sensation is symmetric to  light touch and temperature in the arms and legs. Deep Tendon Reflexes: 2+ and symmetric in the biceps and patellae. Toes are downgoing bilaterally. FNF and HKS are intact bilaterally. Gait - Deferred  I have reviewed labs in epic and the pertinent results are: None at time of assessment.  I have reviewed the images obtained: NCT head showed no acute ischemic changes, hemorrhage or mass. CTA head and neck showed left ICA occlusion at the takeoff with some reconstitution at the level of the supraclinoid and only minimal stenosis at proximal RM1.  Assessment: Paul Fitzgerald. is a 71 y.o. 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 NCT head did not show acute ischemic changes. The patient has minimal deficits and is outside the window for thrombolytics. Discussed the case with neurointervention and intervention on such an occlusion carries a high risk of causing significantly disabling stroke and based on his low NIHSS score with minimal deficits. He would benefit best by conservative management at this time.  Recommended aspirin 324mg  and clopidogrel 300mg  STAT.  Impression:  Left ICA occlusion  Plan: - Head of bed flat. - Permissive hypertension first 24 h < 220/110.   - Transfer to [facility] for ICU admission. - MRI brain without contrast. - Recommend vascular imaging with MRA head and neck.  - Recommend TTE. - Recommend labs: HbA1c, lipid panel, TSH. - Recommend Statin if LDL > 70 - Continue aspirin 81mg  daily. - Continue clopidogrel 75mg  daily. - Telemetry monitoring for arrhythmia. - Recommend bedside Swallow screen. - Recommend Stroke education. - Recommend PT/OT/SLP consult.  Late addendum:  Family arrived  and provided additional information - The patient has stage 3/4 Alzheimer's disease with behavioral disturbance. They add  that he is at risk for wandering and he can become aggressive. Triggers for his aggression are: women, wallets, keys, money.   This patient is critically ill and at significant risk of neurological worsening, death and care requires constant monitoring of vital signs, hemodynamics,respiratory and cardiac monitoring, neurological assessment, discussion with family, other specialists and medical decision making of high complexity. I spent 73 minutes of neurocritical care time  in the care of  this patient. This was time spent independent of any time provided by nurse practitioner or PA.  Electronically signed by:  Lynnae Sandhoff, MD Page: [id number] 02/07/2021, 12:46 PM  If 7pm- 7am, please page neurology on call as listed in [place].

## 2021-02-07 NOTE — ED Notes (Signed)
Neurologist at bedside talking with family of pt. [facility] on way to get pt.

## 2021-02-07 NOTE — ED Notes (Signed)
Pt given 4mg  IV labetalol for hypertension at this time. Verbal order from Dr. Tamala Julian. Pressure is now 214/97.

## 2021-02-07 NOTE — Consult Note (Addendum)
Neurology Consultation  Reason for Consult: Aphasia   CC: Presents for speech changes   History is obtained from:daughter, Caryl Pina  HPI: Paul Fitzgerald is a left handed 71 year old gentleman with a history of Alzheimer's Dementia with behavioral Disturbance, although daughter states that his diagnosis is that of vascular dementia and that he has had multiple TIAs of unknown symptomatology over the years.  He is known to have aggressive behavior and per family, he is at risk for wandering and he can become aggressive. Triggers for his aggression are: women, wallets, keys, money.  He presents from [place] with initial concern for stroke. Last known well 0815 today 1/13. He presented [facility] after his daughter heard a loud noise in the patient's bedroom. She went in and found patient on the floor next to the bed, leaning on it. He was not speaking; however, when Caryl Pina repeatedly prompted the patient to say her name, he was able to clearly. The rest of his speech was garbled and incoherent.   This persisted. He had a head CT at [facility] without acute findings and a CTA that revealed a proximal left ICA occlusion of indeterminate age with reconstitution of distal flow.   There were no other symptoms such as weakness, facial droop, visual changes. He denies recent constitutional symptoms and his daughter confirms this.   Premorbid modified Rankin scale (mRS):  3-Moderate disability-requires help but walks WITHOUT assistance  ROS: Unable to obtain due to baseline mental status.  Past Medical History:  Past Medical History:  Diagnosis Date   Hypertension    TIA (transient ischemic attack)     No family history on file. No family history on file.  Allergies:  No Known Allergies  Social History:   reports that he has been smoking cigarettes. He has a 12.50 pack-year smoking history. He does not have any smokeless tobacco history on file. No history on file for alcohol use and drug use.     Medications (Not in a hospital admission)   Current vital signs: Vitals with BMI 02/07/2021 02/07/2021 02/07/2021  Height - - -  Weight - - -  BMI - - -  Systolic [id number]  Diastolic [id number] 93 97  Pulse 95 82 83   Examination:  GENERAL: Awake, alert in NAD. Pleasant. Cachectic.  HEENT: - Normocephalic and atraumatic, dry mm, no LN++, no Thyromegally LUNGS - Clear to auscultation bilaterally with no wheezes CV - S1S2 RRR, no m/r/g, equal pulses bilaterally. ABDOMEN - Soft, nontender, nondistended with normoactive BS Ext: warm, well perfused, intact peripheral pulses, no edema  NEURO:  Mental Status: AA&Ox1 person only . This is baseline he does not know age, this is baseline  Language: speech is clear.  Follows most simple commands but not able to follow complex commands, ideomotor apraxia apparent. Fair Naming, poor repetition, impaired fluency/comprehension.  Cranial Nerves: PERRL EOMI, visual fields full, no facial asymmetry, facial sensation intact, hearing intact, tongue/uvula/soft palate midline, normal sternocleidomastoid and trapezius muscle strength. No evidence of tongue atrophy or fibrillations Motor: 5/5 throughout  Tone: is normal and bulk is decreased  DTRs not tested  Sensation- Intact to light touch bilaterally Coordination: FTN intact bilaterally, mild left heel to shin ataxia  Gait- deferred  NIHSS 1 commands 1 questions  1 impaired left heel to shin, inconsistent    1 aphasia  --- 4  Labs I have reviewed labs in epic and the results pertinent to this consultation are: Ical low 1.08 ETOH negative  Hgb elevated 17.1  Imaging I have reviewed the images obtained:, as below    CT-head personally reviewed: significant generalized/central atrophy with ex-vacuo dilatation of ventricles. No acute ischemia noted. Chronic appearing right caudate and thalamic ischemia noted   CTA head and neck: left ICA occlusion of unknown chronicicty. Personally, unable to  ascertain collateral flow but there is distal reconstitution at level of clinoid. Right ICA atherosclerosis with 50% stenosis, vertebral arteries both atherosclerotic as well, more focal on right vert at C3-4 level. R M1 stenosis which is mild and moderate BL PCA stenosis   CTP: CBF vol 0, perfusion 0, mismatch thus 0 : no infarct core nor penumbra ascertained   MRI examination of the brain pending    Assessment: Paul Fitzgerald is a 71 year old gentleman presenting with acute onset speech abnormalities.  - His head CT was unremarkable, but CTA revealed a proximal left ICA occlusion with distal flow reconstitution at the level of the clinoid.  - CTP did not reveal core nor penumbra, making ischemic stroke unlikely.  - He does not appear symptomatic as would be expected from an acute left ICA occlusion, so chronicity of the occlusion is more favored.  - He is now largely back to his baseline level of speech and cognitive impairment secondary to Alzheimer's versus multi-infarct dementia.  - BP remains in 220/110s in ED. Hypertensive encephalopathy is also on the DDx.  - MRI will help ascertain possible presence of acute ischemic infarction, which cannot be fully ruled out by clinical exam or CTP. We do not expect a large area, if any, to be involved.  - More likely that presentation is secondary to hypertensive urgency, made more clinically apparent in the setting of underlying decreased neurological reserve due to neurodegenerative dementia.   Impression: Acute ischemic infarct versus hypertensive urgency   Recommendations: - Maintenance fluids in setting of left ICA occlusion/stenosis - To strike a balance between the differential diagnoses of stroke versus hypertensive urgency, recommending modified permissive HTN to mas SBP of 180. Start clevidipine drip if necessary.  - MRI brain - If MRI brain reveals acute infarction, will require TTE  - If no acute ischemia on MRI, would treat as hypertensive  emergency with judicious BP lowering. In that event, lower the SBP to < 160.  - Loaded with aspirin and plavix - Daily aspirin 81mg  and plavix 75 mg  - Labs: hga1c, Lipid profile, b12 level, folate, TSH w T4, CBC, BMP  - UA, UDS    Discussed with Dr. Cheral Marker. -- Lennie Hummer, PA-C Neurology Department   I have seen and examined the patient. I have formulated the assessment and recommendations. 71 year old gentleman presenting with acute onset speech abnormalities in the context of underlying dementia and severe HTN. Exam reveals a cognitively impaired patient in NAD with no focal weakness, facial droop or sensory loss. DDx and recommendations as above.  Electronically signed: Dr. Kerney Elbe

## 2021-02-07 NOTE — Progress Notes (Signed)
Pt brought down to MRI for exam via pt transport. Pt safety screened verbally with daughter prior to exam. Pt prepared for exam and began scanning. Pt moving continuously on first imaging sequence. Pt began attempting to pull themselves out of the scanner. Upon getting pt out of the scanner, pt began vomiting. Assisted pt. Attempted to call RN but no answer. Unable to safely scan pt in current state. Was unable to obtain any diagnostic images. Pt sent back to ED via pt transport.

## 2021-02-07 NOTE — ED Notes (Signed)
EDP was just at bedside. SBP can be up to 220, if higher, treat with PRN labetalol. HOB to be kept 30 degrees or less.

## 2021-02-07 NOTE — ED Provider Notes (Signed)
[facility] Provider Note    Event Date/Time   First MD Initiated Contact with Patient 02/07/21 1237     (approximate)   History   Stroke EMS   HPI  Paul Fitzgerald. is a 71 y.o. male who presents to the ED for evaluation of Stroke EMS   No history within our system and uncertain medical history.  Patient presents to the ED via EMS from home for evaluation of difficulty speaking and confusion.  Reportedly normal around 815 this morning, but family found him confused and unable to get the words out so EMS was called.  Here in the ED, patient is unable to provide any relevant history due to his word finding difficulties and garbled speech.  No known blood thinners.  Physical Exam   Triage Vital Signs: ED Triage Vitals  Enc Vitals Group     BP      Pulse      Resp      Temp      Temp src      SpO2      Weight      Height      Head Circumference      Peak Flow      Pain Score      Pain Loc      Pain Edu?      Excl. in [place]?     Most recent vital signs: Vitals:   02/07/21 1400 02/07/21 1430  BP: (!) 163/123 (!) 205/108  Pulse: 79 86  Resp: 16 (!) 27  SpO2: 99% 100%    General: Awake, no distress.  Garbled speech and difficult to understand. CV:  Good peripheral perfusion.  RRR.  Hypertensive. Resp:  Normal effort.  Abd:  No distention.  MSK:  No deformity noted.  Neuro:  Aphasic and dysarthric.  Word finding difficulties and naming difficulties.  He does follow commands in all 4 extremities without apparent deficit to the extremities. Other:     ED Results / Procedures / Treatments   Labs (all labs ordered are listed, but only abnormal results are displayed) Labs Reviewed  CBC - Abnormal; Notable for the following components:      Result Value   Hemoglobin 17.1 (*)    All other components within normal limits  COMPREHENSIVE METABOLIC PANEL - Abnormal; Notable for the following components:   Sodium 134 (*)    Glucose, Bld  102 (*)    Total Bilirubin 1.7 (*)    All other components within normal limits  RESP PANEL BY RT-PCR (FLU A&B, COVID) [id number]  PROTIME-INR  APTT  DIFFERENTIAL  I-STAT CREATININE, ED  CBG MONITORING, ED    EKG Sinus rhythm, rate of 92 bpm.  Normal axis and intervals.  Stigmata of LVH.  No acutely ischemic features, though somewhat poor quality.  RADIOLOGY CT head reviewed by me without evidence of acute intracranial pathology. 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  Official radiology report(s): CT CEREBRAL PERFUSION W CONTRAST  Result Date: 02/07/2021 CLINICAL DATA:  Neuro deficit, acute, stroke suspected EXAM: CT PERFUSION BRAIN TECHNIQUE: Multiphase CT imaging of the brain was performed following IV bolus contrast injection. Subsequent parametric perfusion maps were calculated using RAPID software. RADIATION DOSE REDUCTION: This exam was performed according to the departmental dose-optimization program which includes automated exposure control, adjustment of the mA and/or kV according to patient size and/or use of iterative reconstruction technique. CONTRAST:  56mL OMNIPAQUE IOHEXOL 350  MG/ML SOLN COMPARISON:  None. FINDINGS: Some motion artifact is present at later time points. CBF (<30%) Volume: 52mL Perfusion (Tmax>6.0s) volume: 74mL Mismatch Volume: 68mL ASPECTS on noncontrast CT Head: 10 at 12:42 p.m. today. Infarct Core: 0 mL Infarction Location: None. IMPRESSION: No evidence of core infarction or territory at risk. Electronically Signed   By: Macy Mis M.D.   On: 02/07/2021 14:20   CT HEAD CODE STROKE WO CONTRAST  Result Date: 02/07/2021 CLINICAL DATA:  Code stroke.  Neuro deficit, acute, stroke suspected EXAM: CT HEAD WITHOUT CONTRAST TECHNIQUE: Contiguous axial images were obtained from the base of the skull through the vertex without intravenous contrast. RADIATION DOSE REDUCTION: This exam was performed according to the departmental  dose-optimization program which includes automated exposure control, adjustment of the mA and/or kV according to patient size and/or use of iterative reconstruction technique. COMPARISON:  None. FINDINGS: Brain: There is no acute intracranial hemorrhage, mass effect, or edema. Gray-white differentiation is preserved. Patchy and confluent areas of hypoattenuation in the supratentorial white matter are nonspecific but probably reflect moderate chronic microvascular ischemic changes. There are chronic appearing small vessel infarcts of the right caudate and thalamus. Prominence of the ventricles and sulci reflects parenchymal volume loss. Vascular: No hyperdense vessel. There is intracranial atherosclerotic calcification at the skull base. Skull: Unremarkable. Sinuses/Orbits: No acute abnormality. Other: Mastoid air cells are clear. ASPECTS ([place] Stroke Program Early CT Score) - Ganglionic level infarction (caudate, lentiform nuclei, internal capsule, insula, M1-M3 cortex): 7 - Supraganglionic infarction (M4-M6 cortex): 3 Total score (0-10 with 10 being normal): 10 IMPRESSION: There is no acute intracranial hemorrhage or evidence of acute infarction. ASPECT score is 10. Chronic microvascular ischemic changes. 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 Preliminary results were communicated to Dr. Theda Sers at 12:46 pm on 02/07/2021 by text page via the [facility] messaging system. Electronically Signed   By: Macy Mis M.D.   On: 02/07/2021 12:48   CT ANGIO HEAD NECK W WO CM (CODE STROKE)  Result Date: 02/07/2021 CLINICAL DATA:  Neuro deficit, acute, stroke suspected EXAM: CT ANGIOGRAPHY HEAD AND NECK TECHNIQUE: Multidetector CT imaging of the head and neck was performed using the standard protocol during bolus administration of intravenous contrast. Multiplanar CT image reconstructions and MIPs were obtained to evaluate the vascular anatomy. Carotid stenosis measurements (when applicable)  are obtained utilizing NASCET criteria, using the distal internal carotid diameter as the denominator. RADIATION DOSE REDUCTION: This exam was performed according to the departmental dose-optimization program which includes automated exposure control, adjustment of the mA and/or kV according to patient size and/or use of iterative reconstruction technique. CONTRAST:  29mL OMNIPAQUE IOHEXOL 350 MG/ML SOLN COMPARISON:  None. FINDINGS: CTA NECK Aortic arch: Calcified plaque along the arch and patent great vessel origins. No high-grade proximal subclavian stenosis. Right carotid system: Patent. There is eccentric primarily noncalcified plaque along the mid common carotid with less than 50% stenosis. Primarily calcified plaque is present at the distal common carotid and proximal internal carotid with less than 50% stenosis. Left carotid system: Common carotid is patent with mixed plaque causing less than 50% stenosis. There is mixed plaque at the ICA origin with occlusion. No reconstitution in the neck. Vertebral arteries: Patent. Mixed plaque along the right V1 vertebral artery with marked stenosis. Focal marked stenosis the C3-C4 level. The left vertebral artery arises directly from the arch where there is plaque causing marked stenosis. Skeleton: Prior ACDF C4-C7.  Cervical spine degenerative changes. Other neck: Unremarkable. Upper  chest: Emphysema. Review of the MIP images confirms the above findings CTA HEAD Anterior circulation: There is partial reconstitution of the left internal carotid beginning at the level of the clinoid. Intracranial right internal carotid artery is patent with calcified plaque causing mild stenosis. Anterior cerebral arteries are patent. Anterior communicating artery is present. Middle cerebral arteries are patent. Mild stenosis of the proximal right M1 MCA. Posterior circulation: Intracranial vertebral arteries are patent. Basilar artery is patent. Major cerebellar artery origins are  patent. Posterior cerebral arteries are patent with atherosclerotic irregularity. There is moderate stenosis of the distal left P1 and distal to proximal right P1-P2 segments. Additional moderate stenosis of the distal left P2 segment. Venous sinuses: Patent as allowed by contrast bolus timing. 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 Partial reconstitution intracranially at the level of the clinoid. Plaque along the right common and internal carotid arteries causing less than 50% stenosis. Extracranial vertebral artery atherosclerosis. Plaque along the proximal vertebral arteries with marked stenosis. Additional focal marked stenosis on the right at the C3-C4 level. Mild stenosis proximal right M1 MCA. Moderate stenoses of the PCAs bilaterally. Preliminary results were communicated to Dr. Theda Sers at 1:03 pm on 02/07/2021 by text page via the [facility] messaging system. Electronically Signed   By: Macy Mis M.D.   On: 02/07/2021 13:19    PROCEDURES and INTERVENTIONS:  .1-3 Lead EKG Interpretation Performed by: Vladimir Crofts, MD Authorized by: Vladimir Crofts, MD     Interpretation: normal     ECG rate:  80   ECG rate assessment: normal     Rhythm: sinus rhythm     Ectopy: none     Conduction: normal   .Critical Care Performed by: Vladimir Crofts, MD Authorized by: Vladimir Crofts, MD   Critical care provider statement:    Critical care time (minutes):  30   Critical care time was exclusive of:  Separately billable procedures and treating other patients   Critical care was necessary to treat or prevent imminent or life-threatening deterioration of the following conditions:  CNS failure or compromise   Critical care was time spent personally by me on the following activities:  Development of treatment plan with patient or surrogate, discussions with consultants, evaluation of patient's response to treatment, examination of  patient, ordering and review of laboratory studies, ordering and review of radiographic studies, ordering and performing treatments and interventions, pulse oximetry, re-evaluation of patient's condition and review of old charts  Medications  sodium chloride flush (NS) 0.9 % injection 3 mL (3 mLs Intravenous Given 02/07/21 1345)  iohexol (OMNIPAQUE) 350 MG/ML injection 75 mL (75 mLs Intravenous Contrast Given 02/07/21 1248)  clopidogrel (PLAVIX) tablet 300 mg (300 mg Oral Given 02/07/21 1356)  aspirin chewable tablet 324 mg (324 mg Oral Given 02/07/21 1327)  iohexol (OMNIPAQUE) 350 MG/ML injection 40 mL (40 mLs Intravenous Contrast Given 02/07/21 1407)     IMPRESSION / MDM / [place] / ED COURSE  I reviewed the triage vital signs and the nursing notes.  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  He is hypertensive, but we will permit up to systolic [id number].  He has a nonfocal exam to his extremities and follows commands without difficulty, but is obviously aphasic and dysarthric, having significant difficulty providing history and naming.  Noncon CT head without ICH, but angiogram with occlusion to the left ICA of uncertain chronicity.  Perfusion study without a core infarct or penumbra to suggest that he would benefit from intervention endovascularly.  His blood work is benign with normal blood counts, coagulation panel and metabolic panel with marginal hyponatremia.  When discussing with our neurology consultants, they recommend transfer ED to ED to [facility] to facilitate higher level of care for observation considering his high risk for complications in the setting of his ICA occlusion.  Clinical Course as of 02/07/21 1445  Fri Feb 07, 2021  1329 Consulted with Dr. Theda Sers.  Permissive hypertension, IV medications to maintain systolic less than  [id number].  He recommends ED to ED transfer to [facility] for monitoring due to ICA occlusion. [DS]  [id number] Dr. Cheral Marker at [facility] [DS]  [phone] Dr. Cheral Marker ties in Dr. Theda Sers. Now recommending CT perfusion to assess for core and perfusion. If positive then possible Gaspar Cola due to interventionalists busy at the [place].  If negative then to [facility] [DS]  1444 , I discussed with Dr. Regenia Skeeter in the ED at [facility] who agrees for transfer ED to ED.  At the request of neurology. [DS]    Clinical Course User Index [DS] Vladimir Crofts, MD     FINAL CLINICAL IMPRESSION(S) / ED DIAGNOSES   Final diagnoses:  Aphasia     Rx / DC Orders   ED Discharge Orders     None        Note:  This document was prepared using Dragon voice recognition software and may include unintentional dictation errors.   Vladimir Crofts, MD 02/07/21 1450

## 2021-02-07 NOTE — ED Triage Notes (Signed)
Pt in room from CT with stroke coordinator at bedside and pt has seen neurologist and had CT head and CTA head/neck.  Pt scored 4 on NIH with mild to moderate aphasia and dysarthria but no motor symptoms or extinction noted. LKW was 0815 today and pt was noted to have altered mental status per EMS. CBG was 76. Pt is alert and oriented times 2. Pt has 20g IV to R AC by EMS. EMS BP was 201/118. Pt has a fib and hx TIAs but does not take known blood thinners.

## 2021-02-08 DIAGNOSIS — I10 Essential (primary) hypertension: Secondary | ICD-10-CM | POA: Diagnosis not present

## 2021-02-08 DIAGNOSIS — Z7982 Long term (current) use of aspirin: Secondary | ICD-10-CM | POA: Diagnosis not present

## 2021-02-08 DIAGNOSIS — Z8673 Personal history of transient ischemic attack (TIA), and cerebral infarction without residual deficits: Secondary | ICD-10-CM | POA: Diagnosis not present

## 2021-02-08 DIAGNOSIS — Z20822 Contact with and (suspected) exposure to covid-19: Secondary | ICD-10-CM | POA: Diagnosis not present

## 2021-02-08 DIAGNOSIS — G9340 Encephalopathy, unspecified: Secondary | ICD-10-CM | POA: Diagnosis not present

## 2021-02-08 DIAGNOSIS — Z720 Tobacco use: Secondary | ICD-10-CM | POA: Diagnosis present

## 2021-02-08 DIAGNOSIS — F17210 Nicotine dependence, cigarettes, uncomplicated: Secondary | ICD-10-CM | POA: Diagnosis not present

## 2021-02-08 DIAGNOSIS — R4182 Altered mental status, unspecified: Secondary | ICD-10-CM | POA: Diagnosis present

## 2021-02-08 LAB — COMPREHENSIVE METABOLIC PANEL
ALT: 12 U/L (ref 0–44)
AST: 19 U/L (ref 15–41)
Albumin: 3.5 g/dL (ref 3.5–5.0)
Alkaline Phosphatase: 86 U/L (ref 38–126)
Anion gap: 15 (ref 5–15)
BUN: 12 mg/dL (ref 8–23)
CO2: 22 mmol/L (ref 22–32)
Calcium: 8.9 mg/dL (ref 8.9–10.3)
Chloride: 100 mmol/L (ref 98–111)
Creatinine, Ser: 1 mg/dL (ref 0.61–1.24)
GFR, Estimated: >60 mL/min (ref >60)
Glucose, Bld: 101 mg/dL — ABNORMAL HIGH (ref 70–99)
Potassium: 3.8 mmol/L (ref 3.5–5.1)
Sodium: 137 mmol/L (ref 135–145)
Total Bilirubin: 1.4 mg/dL — ABNORMAL HIGH (ref 0.3–1.2)
Total Protein: 6.7 g/dL (ref 6.5–8.1)

## 2021-02-08 LAB — CBC
HCT: 46.5 % (ref 39.0–52.0)
Hemoglobin: 15.7 g/dL (ref 13.0–17.0)
MCH: 32 pg (ref 26.0–34.0)
MCHC: 33.8 g/dL (ref 30.0–36.0)
MCV: 94.9 fL (ref 80.0–100.0)
Platelets: 228 10*3/uL (ref 150–400)
RBC: 4.9 MIL/uL (ref 4.22–5.81)
RDW: 12.2 % (ref 11.5–15.5)
WBC: 8.5 10*3/uL (ref 4.0–10.5)
nRBC: 0 % (ref 0.0–0.2)

## 2021-02-08 LAB — MAGNESIUM: Magnesium: 2.1 mg/dL (ref 1.7–2.4)

## 2021-02-08 MED ORDER — ATORVASTATIN CALCIUM 40 MG PO TABS
40.0000 mg | ORAL_TABLET | Freq: Every day | ORAL | Status: DC
  Administered 2021-02-08 – 2021-02-09 (×2): 40 mg via ORAL
  Filled 2021-02-08 (×2): qty 1

## 2021-02-08 MED ORDER — CLOPIDOGREL BISULFATE 75 MG PO TABS
75.0000 mg | ORAL_TABLET | Freq: Every day | ORAL | Status: DC
  Administered 2021-02-08 – 2021-02-09 (×2): 75 mg via ORAL
  Filled 2021-02-08 (×2): qty 1

## 2021-02-08 MED ORDER — ASPIRIN 81 MG PO CHEW
81.0000 mg | CHEWABLE_TABLET | Freq: Once | ORAL | Status: AC
  Administered 2021-02-09: 81 mg via ORAL
  Filled 2021-02-08: qty 1

## 2021-02-08 MED ORDER — NICOTINE 14 MG/24HR TD PT24: 14.0000 mg | MEDICATED_PATCH | Freq: Every day | TRANSDERMAL | Status: DC | PRN

## 2021-02-08 MED ORDER — LABETALOL HCL 5 MG/ML IV SOLN: 10.0000 mg | INTRAVENOUS | Status: DC | PRN

## 2021-02-08 NOTE — ED Notes (Signed)
Breakfast orders placed 

## 2021-02-08 NOTE — ED Notes (Signed)
Morrie Sheldon daughter [phone] requesting an update on the patient

## 2021-02-08 NOTE — ED Notes (Signed)
PT at bedside.

## 2021-02-08 NOTE — ED Notes (Signed)
Pt eating breakfast 

## 2021-02-08 NOTE — H&P (Signed)
History and Physical    PLEASE NOTE THAT DRAGON DICTATION SOFTWARE WAS USED IN THE CONSTRUCTION OF THIS NOTE.   Paul Fitzgerald. ZOX:[id number] DOB: 1950-03-11 DOA: 02/07/2021  PCP: Pcp, No (will need to further address) Patient coming from: home   I have personally briefly reviewed patient's old medical records in [facility]  Chief Complaint: Confusion  HPI: Paul Fitzgerald. is a 71 y.o. male with medical history significant for essential hypertension, chronic tobacco abuse, TIA, who is admitted to [facility] on 02/07/2021 with acute encephalopathy concerning for acute ischemic CVA after presenting from home to [facility] for evaluation of confusion.  The following history is provided by the patient as well as the patient's daughter, in addition to my discussions with the EDP, and via chart review.  The patient was reportedly in his normal state of health this morning, when at approximately 8:15 AM on 02/07/2021, while talking with his daughter, became acutely confused and disoriented, forgetting the name of his daughter and also becoming unsure as to his current location.  There may have also been an element of speech slurring at that time as well, although this latter aspect subsequently resolved.  Denies any associated acute focal weakness, paresthesias, numbness, dysphagia, dizziness, vertigo, nausea, vomiting, change in vision, blurry vision, diplopia, word finding difficulties,facial droop, or headache.  Denies any associated chest pain, shortness of breath, palpitations, diaphoresis, presyncope, or syncope.  Patient reportedly has a history of TIA, but does not recall the nature of the symptoms that he was experiencing at the time of that diagnosis.  Medical history notable for essential hypertension, which appears to be visualized on medications, as the patient is not on any scheduled antihypertensive medications at home.  He also has a history of chronic tobacco abuse, noting  that he is a current smoker, having smoked half pack per day over at least the last 25 years.  Denies any routine alcohol consumption or use of recreational drugs.  Denies any history of known hyperlipidemia, diabetes, atrial fibrillation, obstructive sleep apnea.  Reports good compliance with his home antiplatelet regimen, which consists of daily baby aspirin.  States that he takes this in the context of a history of TIA, as above.  Not currently on any statin medication at home.     ED Course:  Vital signs in the ED were notable for the following: Afebrile; heart rate 78-85; initial blood pressure 199/93, with most recent blood pressure noted to be 129/71; respiratory rate 16-20, oxygen saturation 96% on room air.  Labs were notable for the following: CMP notable for the following: Sodium 135, creatinine 0.97, glucose 103.  CBC notable for white cell count 8000, platelet count 260.  Urinalysis showed no white blood cells, leukocyte Estrace/nitrate negative,.  Urinary drug screen was found to be negative.  COVID-19/influenza PCR negative.  Imaging and additional notable ED work-up: EKG shows sinus rhythm with nonspecific intraventricular conduction delay, heart rate 92, and no evidence of T wave or ST changes, including no evidence of ST elevation.  Noncontrast CT that shows no evidence of acute intracranial process, including no evidence of intracranial hemorrhage or acute ischemic infarct.  CTA head and neck notable for age-indeterminate occlusion of the left ICA at its origin, atherosclerotic disease involving the right common and internal carotid arteries resulting in less than 50% stenosis; plaque along the proximal vertebral arteries with marked stenosis, as well as additional foci of marked stenosis on the right at the level of C3-C4;  additionally, this imaging demonstrated mild stenosis of the proximal right M1 MCA as well as moderate stenosis of the bilateral PCA.  CT cerebral perfusion study was  negative.  EDP discussed patient's case and imaging with the on-call neurologist, who recommended admission to the hospital service for further evaluation management of concern for TIA versus acute ischemic CVA, he including pursuit of MRI brain.  Neurology to formally consult, with additional recommendations to follow.  Assessment, the patient was admitted for overnight observation to the med telemetry unit for further evaluation management of presenting acute encephalopathy concerning for TIA versus acute ischemic stroke.      Review of Systems: As per HPI otherwise 10 point review of systems negative.   Past Medical History:  Diagnosis Date   Hypertension    TIA (transient ischemic attack)     History reviewed. No pertinent surgical history.  Social History:  reports that he has been smoking cigarettes. He has a 12.50 pack-year smoking history. He does not have any smokeless tobacco history on file. He reports that he does not currently use alcohol. He reports that he does not use drugs.   No Known Allergies  History reviewed. No pertinent family history.   Prior to Admission medications   Medication Sig Start Date End Date Taking? Authorizing Provider  aspirin EC 81 MG tablet Take 81 mg by mouth at bedtime. Swallow whole.   Yes [provider]     Objective    Physical Exam: Vitals:   02/07/21 2200 02/07/21 2215 02/07/21 2230 02/07/21 2245  BP: 112/66 119/64 114/68 129/71  Pulse: 81 82 86 86  Resp: 17 14 20 17   Temp:      TempSrc:      SpO2: 97% 97% 96% 97%    General: appears to be stated age; alert, confused Skin: warm, dry, no rash Head:  AT/[place] Mouth:  Oral mucosa membranes appear moist, normal dentition Neck: supple; trachea midline Heart:  RRR; did not appreciate any M/R/G Lungs: CTAB, did not appreciate any wheezes, rales, or rhonchi Abdomen: + BS; soft, ND, NT Vascular: 2+ pedal pulses b/l; 2+ radial pulses b/l Extremities: no peripheral  edema, no muscle wasting Neuro: 5/5 strength of the proximal and distal flexors and extensors of the upper and lower extremities bilaterally; sensation intact in upper and lower extremities b/l; cranial nerves II through XII grossly intact; no pronator drift; no evidence suggestive of slurred speech, dysarthria, or facial droop; Normal muscle tone. No tremors.   Labs on Admission: I have personally reviewed following labs and imaging studies  CBC: Recent Labs  Lab 02/07/21 1308 02/07/21 1629 02/07/21 1734  WBC 5.7 8.0  --   NEUTROABS 4.3 6.6  --   HGB 17.1* 16.9 16.7  HCT 48.0 48.1 49.0  MCV 92.3 92.0  --   PLT 229 260  --    Basic Metabolic Panel: Recent Labs  Lab 02/07/21 1308 02/07/21 1629 02/07/21 1734  NA 134* 135 135  K 4.2 3.8 3.7  CL 99 99 100  CO2 26 23  --   GLUCOSE 102* 103* 96  BUN 10 8 9   CREATININE 0.88 0.97 0.80  CALCIUM 9.4 9.2  --    GFR: Estimated Creatinine Clearance: 72.6 mL/min (by C-G formula based on SCr of 0.8 mg/dL). Liver Function Tests: Recent Labs  Lab 02/07/21 1308 02/07/21 1629  AST 17 16  ALT 10 13  ALKPHOS 104 97  BILITOT 1.7* 1.4*  PROT 7.3 7.1  ALBUMIN 3.8  3.7   No results for input(s): LIPASE, AMYLASE in the last 168 hours. No results for input(s): AMMONIA in the last 168 hours. Coagulation Profile: Recent Labs  Lab 02/07/21 1308 02/07/21 1629  INR 0.9 0.9   Cardiac Enzymes: No results for input(s): CKTOTAL, CKMB, CKMBINDEX, TROPONINI in the last 168 hours. BNP (last 3 results) No results for input(s): PROBNP in the last 8760 hours. HbA1C: Recent Labs    02/07/21 2003  HGBA1C 5.2   CBG: No results for input(s): GLUCAP in the last 168 hours. Lipid Profile: Recent Labs    02/07/21 2002  CHOL 294*  HDL 58  LDLCALC 213*  TRIG 113  CHOLHDL 5.1   Thyroid Function Tests: Recent Labs    02/07/21 2003  TSH 4.077  FREET4 0.95   Anemia Panel: No results for input(s): VITAMINB12, FOLATE, FERRITIN, TIBC,  IRON, RETICCTPCT in the last 72 hours. Urine analysis:    Component Value Date/Time   COLORURINE YELLOW 02/07/2021 2250   APPEARANCEUR CLEAR 02/07/2021 2250   LABSPEC 1.020 02/07/2021 2250   PHURINE 6.5 02/07/2021 2250   GLUCOSEU NEGATIVE 02/07/2021 2250   HGBUR TRACE (A) 02/07/2021 2250   BILIRUBINUR NEGATIVE 02/07/2021 2250   KETONESUR NEGATIVE 02/07/2021 2250   PROTEINUR NEGATIVE 02/07/2021 2250   NITRITE NEGATIVE 02/07/2021 2250   LEUKOCYTESUR NEGATIVE 02/07/2021 2250    Radiological Exams on Admission: MR BRAIN WO CONTRAST  Result Date: 02/07/2021 CLINICAL DATA:  Difficulty speaking and altered mental status EXAM: MRI HEAD WITHOUT CONTRAST TECHNIQUE: Multiplanar, multiecho pulse sequences of the brain and surrounding structures were obtained without intravenous contrast. COMPARISON:  Head CT 02/07/2021 FINDINGS: Brain: There is a small area of subacute ischemia in the left thalamus. No acute or chronic hemorrhage. Hyperintense T2-weighted signal is moderately widespread throughout the white matter. Advanced atrophy for age. There are multiple old small vessel infarcts of the basal ganglia and cerebellum. The midline structures are normal. Vascular: Major flow voids are preserved. Skull and upper cervical spine: Normal calvarium and skull base. Visualized upper cervical spine and soft tissues are normal. Sinuses/Orbits:No paranasal sinus fluid levels or advanced mucosal thickening. No mastoid or middle ear effusion. Normal orbits. IMPRESSION: 1. Small area of subacute ischemia in the left thalamus. No hemorrhage or mass effect. 2. Advanced atrophy and chronic ischemic microangiopathy. Electronically Signed   By: Deatra Robinson M.D.   On: 02/07/2021 23:49   CT CEREBRAL PERFUSION W CONTRAST  Result Date: 02/07/2021 CLINICAL DATA:  Neuro deficit, acute, stroke suspected EXAM: CT PERFUSION BRAIN TECHNIQUE: Multiphase CT imaging of the brain was performed following IV bolus contrast injection.  Subsequent parametric perfusion maps were calculated using RAPID software. RADIATION DOSE REDUCTION: This exam was performed according to the departmental dose-optimization program which includes automated exposure control, adjustment of the mA and/or kV according to patient size and/or use of iterative reconstruction technique. CONTRAST:  40mL OMNIPAQUE IOHEXOL 350 MG/ML SOLN COMPARISON:  None. FINDINGS: Some motion artifact is present at later time points. CBF (<30%) Volume: 0mL Perfusion (Tmax>6.0s) volume: 0mL Mismatch Volume: 0mL ASPECTS on noncontrast CT Head: 10 at 12:42 p.m. today. Infarct Core: 0 mL Infarction Location: None. IMPRESSION: No evidence of core infarction or territory at risk. Electronically Signed   By: Guadlupe Spanish M.D.   On: 02/07/2021 14:20   CT HEAD CODE STROKE WO CONTRAST  Result Date: 02/07/2021 CLINICAL DATA:  Code stroke.  Neuro deficit, acute, stroke suspected EXAM: CT HEAD WITHOUT CONTRAST TECHNIQUE: Contiguous axial images were obtained from the  base of the skull through the vertex without intravenous contrast. RADIATION DOSE REDUCTION: This exam was performed according to the departmental dose-optimization program which includes automated exposure control, adjustment of the mA and/or kV according to patient size and/or use of iterative reconstruction technique. COMPARISON:  None. FINDINGS: Brain: There is no acute intracranial hemorrhage, mass effect, or edema. Gray-white differentiation is preserved. Patchy and confluent areas of hypoattenuation in the supratentorial white matter are nonspecific but probably reflect moderate chronic microvascular ischemic changes. There are chronic appearing small vessel infarcts of the right caudate and thalamus. Prominence of the ventricles and sulci reflects parenchymal volume loss. Vascular: No hyperdense vessel. There is intracranial atherosclerotic calcification at the skull base. Skull: Unremarkable. Sinuses/Orbits: No acute  abnormality. Other: Mastoid air cells are clear. ASPECTS (Alberta Stroke Program Early CT Score) - Ganglionic level infarction (caudate, lentiform nuclei, internal capsule, insula, M1-M3 cortex): 7 - Supraganglionic infarction (M4-M6 cortex): 3 Total score (0-10 with 10 being normal): 10 IMPRESSION: There is no acute intracranial hemorrhage or evidence of acute infarction. ASPECT score is 10. Chronic microvascular ischemic changes. 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 Preliminary results were communicated to Dr. Thomasena Edisollins at 12:46 pm on 02/07/2021 by text page via the [facility] messaging system. Electronically Signed   By: Guadlupe SpanishPraneil  Patel M.D.   On: 02/07/2021 12:48   CT ANGIO HEAD NECK W WO CM (CODE STROKE)  Result Date: 02/07/2021 CLINICAL DATA:  Neuro deficit, acute, stroke suspected EXAM: CT ANGIOGRAPHY HEAD AND NECK TECHNIQUE: Multidetector CT imaging of the head and neck was performed using the standard protocol during bolus administration of intravenous contrast. Multiplanar CT image reconstructions and MIPs were obtained to evaluate the vascular anatomy. Carotid stenosis measurements (when applicable) are obtained utilizing NASCET criteria, using the distal internal carotid diameter as the denominator. RADIATION DOSE REDUCTION: This exam was performed according to the departmental dose-optimization program which includes automated exposure control, adjustment of the mA and/or kV according to patient size and/or use of iterative reconstruction technique. CONTRAST:  75mL OMNIPAQUE IOHEXOL 350 MG/ML SOLN COMPARISON:  None. FINDINGS: CTA NECK Aortic arch: Calcified plaque along the arch and patent great vessel origins. No high-grade proximal subclavian stenosis. Right carotid system: Patent. There is eccentric primarily noncalcified plaque along the mid common carotid with less than 50% stenosis. Primarily calcified plaque is present at the distal common carotid and proximal  internal carotid with less than 50% stenosis. Left carotid system: Common carotid is patent with mixed plaque causing less than 50% stenosis. There is mixed plaque at the ICA origin with occlusion. No reconstitution in the neck. Vertebral arteries: Patent. Mixed plaque along the right V1 vertebral artery with marked stenosis. Focal marked stenosis the C3-C4 level. The left vertebral artery arises directly from the arch where there is plaque causing marked stenosis. Skeleton: Prior ACDF C4-C7.  Cervical spine degenerative changes. Other neck: Unremarkable. Upper chest: Emphysema. Review of the MIP images confirms the above findings CTA HEAD Anterior circulation: There is partial reconstitution of the left internal carotid beginning at the level of the clinoid. Intracranial right internal carotid artery is patent with calcified plaque causing mild stenosis. Anterior cerebral arteries are patent. Anterior communicating artery is present. Middle cerebral arteries are patent. Mild stenosis of the proximal right M1 MCA. Posterior circulation: Intracranial vertebral arteries are patent. Basilar artery is patent. Major cerebellar artery origins are patent. Posterior cerebral arteries are patent with atherosclerotic irregularity. There is moderate stenosis of the distal left P1 and distal  to proximal right P1-P2 segments. Additional moderate stenosis of the distal left P2 segment. Venous sinuses: Patent as allowed by contrast bolus timing. 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 Partial reconstitution intracranially at the level of the clinoid. Plaque along the right common and internal carotid arteries causing less than 50% stenosis. Extracranial vertebral artery atherosclerosis. Plaque along the proximal vertebral arteries with marked stenosis. Additional focal marked stenosis on the right at the C3-C4 level. Mild stenosis proximal right M1  MCA. Moderate stenoses of the PCAs bilaterally. Preliminary results were communicated to Dr. Thomasena Edis at 1:03 pm on 02/07/2021 by text page via the [facility] messaging system. Electronically Signed   By: Guadlupe Spanish M.D.   On: 02/07/2021 13:19     EKG: Independently reviewed, with result as described above.    Assessment/Plan   Principal Problem:   Acute encephalopathy Active Problems:   Hypertension   Tobacco abuse     #) Acute encephalopathy: Acute onset of confusion with disorientation starting at 8:15 AM on 02/07/2021, potentially associated with dysarthria, which is subsequently resolved, without evidence of additional acute focal neurologic deficits, with presentation concerning for TIA versus acute ischemic stroke given the abrupt, acute onset of the symptoms, in the context of a documented history of prior TIA as well as multiple acute ischemic CVA risk factors, as further detailed below.  CT head showed no evidence of acute intracranial process, while CTA head and neck were performed, with results as detailed above, followed by negative CT cerebral perfusion study, as above.   EDP discussed patient's case and imaging with the on-call neurologist, who recommended admission to the hospital service for further evaluation management of concern for TIA versus acute ischemic CVA, he including pursuit of MRI brain.  Neurology to formally consult, with additional recommendations to follow.  Not a tPA candidate as the patient presented outside of the window for administration of such.  Of note, the patient reportedly possesses multiple modifiable CVA risk factors including a history of essential hypertension which is managed exclusively via lifestyle modifications in the absence of any home antihypertensive medications, as well as a history of chronic tobacco abuse, for which the patient acknowledges that he is a current smoker.  No known history of diabetes, hyperlipidemia, obstructive sleep apnea,  or paroxysmal atrial fibrillation.  EKG shows sinus rhythm, without evidence of acute ischemic changes, as further detailed above.  Current outpatient anti-lipid regimen: None.  Current antiplatelet regimen as outpatient: Daily baby aspirin, without any formal anticoagulation.  We will also observe or permissive hypertension for 24-48 hours following acute onset patient's confusion and potential dysarthria starting at 8:15 AM on 02/07/2021. NIH 1.   Beyond TIA versus acute ischemic stroke, differential for patient's acute encephalopathy also includes hypertensive encephalopathy given degree of initial blood pressure elevation, as quantified above.  No additional organic source of patient's encephalopathy identified at this time, including no evidence of underlying infectious process.    Plan: Nursing bedside swallow evaluation x 1 now, and will not initiate oral medications or diet until the patient has passed this. Head of the bed at 30 degrees. Neuro checks per protocol. VS per protocol. Will allow for permissive hypertension for 24-48 hours following onset of acute focal neurologic deficits, during which will hold home antihypertensive medications, with prn IV labetalol ordered for systolic blood pressure greater than 220 mmHg or diastolic blood pressure greater than 110 mmHg. Monitor on telemetry, including  monitoring for atrial fibrillation as modifiable risk factor for acute ischemic CVA.   MRI brain. TTE without bubble study has been ordered for the morning to evaluate for intracardiac thrombus, septal wall aneurysm, or septal wall defect. Check lipid panel and A1c. PT/OT/ST consults have been ordered to occur in the morning. ASA x 1 dose now.  Counseled the patient on the importance of complete smoking discontinuation.  Check TSH.        #) Essential Hypertension: documented h/o such, managed exclusively via lifestyle modifications in the absence of any antihypertensive medications at home.   Initial.  Blood pressures upon presentation to the ED noted to be in the 190s, subsequent improving into the range of 120s to 140s, raising possibility of hypertensive encephalopathy as potential contributing factor leading to patient's presenting altered mental status, as above.  However, given concern for TIA versus acute ischemic stroke, pending MRI brain, will observe permissive hypertension for now, as further defined above.  Plan: Close monitoring of subsequent BP via routine VS. observance of permissive hypertension, as above, with as needed IV labetalol ordered for systolic pressure greater than 220 mmHg or diastolic blood pressure greater than 110mmHg.       #) Chronic tobacco abuse: Patient knowledges that he is a current smoker, having smoked half pack per day over the last 25 years.  Plan: Counseled the patient on importance of complete smoking discontinuation, particularly in the setting of his history of TIA, with current concern for new TIA versus acute ischemic stroke.  Prn nicotine patch ordered for use during this hospitalization.       DVT prophylaxis: SCD's   Code Status: Full code Family Communication: none Disposition Plan: Per Rounding Team Consults called: case/imaging discussed with on-call neurology, who will formally consult, as further detailed above;  Admission status: Observation; med telemetry   PLEASE NOTE THAT DRAGON DICTATION SOFTWARE WAS USED IN THE CONSTRUCTION OF THIS NOTE.   Chaney BornJustin B Salif Tay DO [facility]  From 7PM - 7AM   02/08/2021, 12:20 AM

## 2021-02-08 NOTE — Evaluation (Signed)
Physical Therapy Evaluation Patient Details Name: Paul Fitzgerald. MRN: [id number] DOB: 1950/06/02 Today's Date: 02/08/2021  History of Present Illness  Pt is a 71 y.o. M who presents with acute onset speech abnormalities. CTA revealed left ICA occlusion with distal flow reconstitution at the level of the clinoid. MRI showing small area of subacute ischemia in the left thalamus. Significant PMH: Alzheimer's dementia, TIA, HTN.  Clinical Impression  PTA, pt lives with his family who provides 24/7 supervision and assist, is independent with ambulation, and requires assist for ADL's. Pt cooperative with evaluation and able to follow basic motor commands. He has garbled speech and is difficult to understand. Pt demonstrating a right lateral lean in sitting and standing. Requiring min-mod assist for functional mobility. Ambulating 100 feet with no assistive device and tactile facilitation for environmental guidance and neutral posture. Discussed with pt family via phone who declined post acute rehab and would like to take him home ASAP to return to familiar environment. Recommend HHPT at follow up and relayed to MD.      Recommendations for follow up therapy are one component of a multi-disciplinary discharge planning process, led by the attending physician.  Recommendations may be updated based on patient status, additional functional criteria and insurance authorization.  Follow Up Recommendations Home health PT    Assistance Recommended at Discharge Frequent or constant Supervision/Assistance  Patient can return home with the following  A little help with walking and/or transfers;A lot of help with bathing/dressing/bathroom;Assistance with cooking/housework;Direct supervision/assist for medications management    Equipment Recommendations Other (comment) (tub bench)  Recommendations for Other Services       Functional Status Assessment Patient has had a recent decline in their functional status  and demonstrates the ability to make significant improvements in function in a reasonable and predictable amount of time.     Precautions / Restrictions Precautions Precautions: Fall Restrictions Weight Bearing Restrictions: No      Mobility  Bed Mobility Overal bed mobility: Needs Assistance Bed Mobility: Supine to Sit;Sit to Supine     Supine to sit: Min assist Sit to supine: Min assist   General bed mobility comments: assist for supine <> sit from stretcher    Transfers Overall transfer level: Needs assistance Equipment used: None Transfers: Sit to/from Stand Sit to Stand: Min assist           General transfer comment: assist to rise and steady from stretcher    Ambulation/Gait Ambulation/Gait assistance: Min assist;Mod assist Gait Distance (Feet): 100 Feet Assistive device: None Gait Pattern/deviations: Step-through pattern;Decreased stride length;Shuffle;Narrow base of support Gait velocity: decreased Gait velocity interpretation: <1.8 ft/sec, indicate of risk for recurrent falls   General Gait Details: Decreased stride length, consistent right lateral lean, requiring min-modA for standing balance. Tactile cues for maitnaining forward momentum  Stairs            Wheelchair Mobility    Modified Rankin (Stroke Patients Only) Modified Rankin (Stroke Patients Only) Pre-Morbid Rankin Score: Moderately severe disability Modified Rankin: Moderately severe disability     Balance Overall balance assessment: Needs assistance Sitting-balance support: Feet supported Sitting balance-Leahy Scale: Poor Sitting balance - Comments: requiring min guard-minA, right lateral lean Postural control: Right lateral lean Standing balance support: No upper extremity supported;During functional activity Standing balance-Leahy Scale: Poor Standing balance comment: requiring minA for static standing balance  Pertinent Vitals/Pain  Pain Assessment: Faces Faces Pain Scale: No hurt    Home Living Family/patient expects to be discharged to:: Private residence Living Arrangements: Children (daughter, SIL, grandchild) Available Help at Discharge: Family;Available 24 hours/day Type of Home: House Home Access: Level entry       Home Layout: One level Home Equipment: Cane - single point;Hospital bed      Prior Function Prior Level of Function : Needs assist             Mobility Comments: independent ADLs Comments: daughter reports pt requires set up-min assist for feeding, assist for bathing, dressing     Hand Dominance        Extremity/Trunk Assessment   Upper Extremity Assessment Upper Extremity Assessment: Defer to OT evaluation    Lower Extremity Assessment Lower Extremity Assessment: Overall WFL for tasks assessed       Communication   Communication: Expressive difficulties  Cognition Arousal/Alertness: Awake/alert Behavior During Therapy: Flat affect Overall Cognitive Status: History of cognitive impairments - at baseline                                 General Comments: History of dementia. Pt with expressive difficulties and difficult to understand speech. Able to follow 1 step basic motor commands.        General Comments      Exercises     Assessment/Plan    PT Assessment Patient needs continued PT services  PT Problem List Decreased strength;Decreased activity tolerance;Decreased balance;Decreased mobility;Decreased cognition;Decreased safety awareness       PT Treatment Interventions Gait training;Functional mobility training;Balance training;Therapeutic activities;Therapeutic exercise;Patient/family education    PT Goals (Current goals can be found in the Care Plan section)  Acute Rehab PT Goals Patient Stated Goal: pt daughter would like him to go home asap PT Goal Formulation: With patient/family Time For Goal Achievement: 02/22/21 Potential to  Achieve Goals: Fair    Frequency Min 4X/week     Co-evaluation               AM-PAC PT "6 Clicks" Mobility  Outcome Measure Help needed turning from your back to your side while in a flat bed without using bedrails?: A Little Help needed moving from lying on your back to sitting on the side of a flat bed without using bedrails?: A Little Help needed moving to and from a bed to a chair (including a wheelchair)?: A Little Help needed standing up from a chair using your arms (e.g., wheelchair or bedside chair)?: A Little Help needed to walk in hospital room?: A Lot Help needed climbing 3-5 steps with a railing? : A Lot 6 Click Score: 16    End of Session Equipment Utilized During Treatment: Gait belt Activity Tolerance: Patient tolerated treatment well Patient left: in bed;with call bell/phone within reach Nurse Communication: Mobility status PT Visit Diagnosis: Unsteadiness on feet (R26.81);Other abnormalities of gait and mobility (R26.89)    Time: 1012-1027 PT Time Calculation (min) (ACUTE ONLY): 15 min   Charges:   PT Evaluation $PT Eval Moderate Complexity: 1 Mod          Wyona Almas, PT, DPT Acute Rehabilitation Services Pager [phone] Office [phone]   Deno Etienne 02/08/2021, 12:39 PM

## 2021-02-08 NOTE — Progress Notes (Addendum)
STROKE TEAM PROGRESS NOTE   INTERVAL HISTORY Patient was in a deep sleep on arrival to room. Has to be sternal rubbed to awaken, but he is cooperative once he is awake. Right leg is weaker than left. He is able to follow commands. He is dysarthric and has garbled speech, moves all extremities antigravity. Hemodynamically stable, hgb 17.1, WBC 5.7, LDL 213.  MRI scan of the brain shows a subacute left thalamic lacunar infarct as well as old lacune's bilaterally.  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  Urine drug screen is negative.  LDL cholesterol is elevated at 213 mg percent and hemoglobin A1c is 5.2 Vitals:   02/08/21 0500 02/08/21 0530 02/08/21 0600 02/08/21 0630  BP: (!) 153/139 139/72 (!) 110/54 110/62  Pulse:  (!) 57  77  Resp: (!) 24 20 17 17   Temp:      TempSrc:      SpO2:  (!) 72%  98%   CBC:  Recent Labs  Lab 02/07/21 1308 02/07/21 1629 02/07/21 1734  WBC 5.7 8.0  --   NEUTROABS 4.3 6.6  --   HGB 17.1* 16.9 16.7  HCT 48.0 48.1 49.0  MCV 92.3 92.0  --   PLT 229 260  --    Basic Metabolic Panel:  Recent Labs  Lab 02/07/21 1308 02/07/21 1629 02/07/21 1734  NA 134* 135 135  K 4.2 3.8 3.7  CL 99 99 100  CO2 26 23  --   GLUCOSE 102* 103* 96  BUN 10 8 9   CREATININE 0.88 0.97 0.80  CALCIUM 9.4 9.2  --    Lipid Panel:  Recent Labs  Lab 02/07/21 2002  CHOL 294*  TRIG 113  HDL 58  CHOLHDL 5.1  VLDL 23  LDLCALC 213*   HgbA1c:  Recent Labs  Lab 02/07/21 2003  HGBA1C 5.2   Urine Drug Screen:  Recent Labs  Lab 02/07/21 2250  LABOPIA NONE DETECTED  COCAINSCRNUR NONE DETECTED  LABBENZ NONE DETECTED  AMPHETMU NONE DETECTED  THCU NONE DETECTED  LABBARB NONE DETECTED    Alcohol Level  Recent Labs  Lab 02/07/21 1629  ETH <10    IMAGING past 24 hours MR BRAIN WO CONTRAST  Result Date: 02/07/2021 CLINICAL DATA:  Difficulty speaking and altered mental status EXAM: MRI HEAD WITHOUT CONTRAST TECHNIQUE: Multiplanar, multiecho pulse sequences of  the brain and surrounding structures were obtained without intravenous contrast. COMPARISON:  Head CT 02/07/2021 FINDINGS: Brain: There is a small area of subacute ischemia in the left thalamus. No acute or chronic hemorrhage. Hyperintense T2-weighted signal is moderately widespread throughout the white matter. Advanced atrophy for age. There are multiple old small vessel infarcts of the basal ganglia and cerebellum. The midline structures are normal. Vascular: Major flow voids are preserved. Skull and upper cervical spine: Normal calvarium and skull base. Visualized upper cervical spine and soft tissues are normal. Sinuses/Orbits:No paranasal sinus fluid levels or advanced mucosal thickening. No mastoid or middle ear effusion. Normal orbits. IMPRESSION: 1. Small area of subacute ischemia in the left thalamus. No hemorrhage or mass effect. 2. Advanced atrophy and chronic ischemic microangiopathy. Electronically Signed   By: Ulyses Jarred M.D.   On: 02/07/2021 23:49   CT CEREBRAL PERFUSION W CONTRAST  Result Date: 02/07/2021 CLINICAL DATA:  Neuro deficit, acute, stroke suspected EXAM: CT PERFUSION BRAIN TECHNIQUE: Multiphase CT imaging of the brain was performed following IV bolus contrast injection. Subsequent parametric perfusion maps were calculated using RAPID software. RADIATION DOSE REDUCTION: This exam  was performed according to the departmental dose-optimization program which includes automated exposure control, adjustment of the mA and/or kV according to patient size and/or use of iterative reconstruction technique. CONTRAST:  76mL OMNIPAQUE IOHEXOL 350 MG/ML SOLN COMPARISON:  None. FINDINGS: Some motion artifact is present at later time points. CBF (<30%) Volume: 50mL Perfusion (Tmax>6.0s) volume: 56mL Mismatch Volume: 56mL ASPECTS on noncontrast CT Head: 10 at 12:42 p.m. today. Infarct Core: 0 mL Infarction Location: None. IMPRESSION: No evidence of core infarction or territory at risk. Electronically  Signed   By: Macy Mis M.D.   On: 02/07/2021 14:20   CT HEAD CODE STROKE WO CONTRAST  Result Date: 02/07/2021 CLINICAL DATA:  Code stroke.  Neuro deficit, acute, stroke suspected EXAM: CT HEAD WITHOUT CONTRAST TECHNIQUE: Contiguous axial images were obtained from the base of the skull through the vertex without intravenous contrast. RADIATION DOSE REDUCTION: This exam was performed according to the departmental dose-optimization program which includes automated exposure control, adjustment of the mA and/or kV according to patient size and/or use of iterative reconstruction technique. COMPARISON:  None. FINDINGS: Brain: There is no acute intracranial hemorrhage, mass effect, or edema. Gray-white differentiation is preserved. Patchy and confluent areas of hypoattenuation in the supratentorial white matter are nonspecific but probably reflect moderate chronic microvascular ischemic changes. There are chronic appearing small vessel infarcts of the right caudate and thalamus. Prominence of the ventricles and sulci reflects parenchymal volume loss. Vascular: No hyperdense vessel. There is intracranial atherosclerotic calcification at the skull base. Skull: Unremarkable. Sinuses/Orbits: No acute abnormality. Other: Mastoid air cells are clear. ASPECTS (Jette Stroke Program Early CT Score) - Ganglionic level infarction (caudate, lentiform nuclei, internal capsule, insula, M1-M3 cortex): 7 - Supraganglionic infarction (M4-M6 cortex): 3 Total score (0-10 with 10 being normal): 10 IMPRESSION: There is no acute intracranial hemorrhage or evidence of acute infarction. ASPECT score is 10. Chronic microvascular ischemic changes. 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 Preliminary results were communicated to Dr. Theda Sers at 12:46 pm on 02/07/2021 by text page via the [facility] messaging system. Electronically Signed   By: Macy Mis M.D.   On: 02/07/2021 12:48   CT ANGIO HEAD NECK W WO CM  (CODE STROKE)  Result Date: 02/07/2021 CLINICAL DATA:  Neuro deficit, acute, stroke suspected EXAM: CT ANGIOGRAPHY HEAD AND NECK TECHNIQUE: Multidetector CT imaging of the head and neck was performed using the standard protocol during bolus administration of intravenous contrast. Multiplanar CT image reconstructions and MIPs were obtained to evaluate the vascular anatomy. Carotid stenosis measurements (when applicable) are obtained utilizing NASCET criteria, using the distal internal carotid diameter as the denominator. RADIATION DOSE REDUCTION: This exam was performed according to the departmental dose-optimization program which includes automated exposure control, adjustment of the mA and/or kV according to patient size and/or use of iterative reconstruction technique. CONTRAST:  18mL OMNIPAQUE IOHEXOL 350 MG/ML SOLN COMPARISON:  None. FINDINGS: CTA NECK Aortic arch: Calcified plaque along the arch and patent great vessel origins. No high-grade proximal subclavian stenosis. Right carotid system: Patent. There is eccentric primarily noncalcified plaque along the mid common carotid with less than 50% stenosis. Primarily calcified plaque is present at the distal common carotid and proximal internal carotid with less than 50% stenosis. Left carotid system: Common carotid is patent with mixed plaque causing less than 50% stenosis. There is mixed plaque at the ICA origin with occlusion. No reconstitution in the neck. Vertebral arteries: Patent. Mixed plaque along the right V1 vertebral artery with marked stenosis.  Focal marked stenosis the C3-C4 level. The left vertebral artery arises directly from the arch where there is plaque causing marked stenosis. Skeleton: Prior ACDF C4-C7.  Cervical spine degenerative changes. Other neck: Unremarkable. Upper chest: Emphysema. Review of the MIP images confirms the above findings CTA HEAD Anterior circulation: There is partial reconstitution of the left internal carotid  beginning at the level of the clinoid. Intracranial right internal carotid artery is patent with calcified plaque causing mild stenosis. Anterior cerebral arteries are patent. Anterior communicating artery is present. Middle cerebral arteries are patent. Mild stenosis of the proximal right M1 MCA. Posterior circulation: Intracranial vertebral arteries are patent. Basilar artery is patent. Major cerebellar artery origins are patent. Posterior cerebral arteries are patent with atherosclerotic irregularity. There is moderate stenosis of the distal left P1 and distal to proximal right P1-P2 segments. Additional moderate stenosis of the distal left P2 segment. Venous sinuses: Patent as allowed by contrast bolus timing. 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 Partial reconstitution intracranially at the level of the clinoid. Plaque along the right common and internal carotid arteries causing less than 50% stenosis. Extracranial vertebral artery atherosclerosis. Plaque along the proximal vertebral arteries with marked stenosis. Additional focal marked stenosis on the right at the C3-C4 level. Mild stenosis proximal right M1 MCA. Moderate stenoses of the PCAs bilaterally. Preliminary results were communicated to Dr. Theda Sers at 1:03 pm on 02/07/2021 by text page via the [facility] messaging system. Electronically Signed   By: Macy Mis M.D.   On: 02/07/2021 13:19    PHYSICAL EXAM  Physical Exam  Constitutional: Appears well-developed and well-nourished elderly male Cardiovascular: Normal rate and regular rhythm.  Respiratory: Effort normal, non-labored breathing  Neuro: Mental Status: Patient is sleepy but can be awakened, dysarthric with garbled speech. Unable to answer orientation questions appropriately. Able to follow simple commands.  Cranial Nerves: II: Pupils are equal, round, and reactive to light.  Slow to blink to threat  on the right III,IV, VI: Left gaze preference V: Facial sensation is symmetric to temperature VII: Right facial droop VIII: Hearing is intact to voice X: Palate elevates symmetrically XI: Shoulder shrug is symmetric. XII: Tongue protrudes midline without atrophy or fasciculations.  Motor: Tone is normal. Bulk is normal. Moves antigravity with all extremities, right lower extremity weaker than left.  Mild right upper extremity weakness as well Sensory: Sensation is symmetric to light touch and temperature in the arms and legs. No extinction to DSS present.  Coordination: FNF intact bilaterally  ASSESSMENT/PLAN Mr. Kiernan Bartlette. is a 71 y.o. male with history of Alzheimer's Dementia with behavioral Disturbance, HTN, TIAs presenting with garbled/incoherent speech after being found on the floor leaning against his bed by his daughter. His daughter states that his diagnosis is that of vascular dementia and that he has had multiple TIAs of unknown symptomatology over the years. He is known to have aggressive behavior and per family, he is at risk for wandering and he can become aggressive. Triggers for his aggression are: women, wallets, keys, money. MRI shows left thalamic stroke likely due to small vessel disease source. Plan to start statin, aspirin, and plavix today. Echocardiogram pending. Therapy currently recommending home health PT.   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 Code Stroke significant generalized/central atrophy with ex-vacuo dilatation of ventricles. No acute ischemia noted. Chronic appearing right caudate and thalamic ischemia noted    CTA head &  neck- Proximal left ICA occlusion with reconstitution of distal flow CT perfusion CBF vol 0, perfusion 0, mismatch thus 0 : no infarct core nor penumbra ascertained  MRI  Small area of subacute ischemia in the left thalamus. No hemorrhage or mass effect. 2D Echo - pending LDL 213 HgbA1c  5.2 VTE prophylaxis - SCDs aspirin 81 mg daily prior to admission, now on aspirin 81 mg daily and clopidogrel 75 mg daily.  Therapy recommendations:  Home health PT Disposition:  pending  Hypertension Home meds:  None Stable Permissive hypertension (OK if < 220/120) but gradually normalize in 5-7 days Long-term BP goal normotensive PRN labetolol 10mg    Hyperlipidemia Home meds:  None LDL 213, goal < 70 Add Atorvastatin 40mg   Continue statin at discharge  Other Stroke Risk Factors Advanced Age >/= 43  Cigarette smoker, advised to stop smoking 1ppd for 25 years Hx stroke/TIA Hx TIA- unknown symptoms  Other Active Problems Vascular dementia?  Diagnosed per daughter, no outside records available.   Hospital day # 0  Patient seen and examined by NP/APP with MD. MD to update note as needed.   Janine Ores, DNP, FNP-BC [facility] Pager: [phone]  STROKE MD NOTE : I have personally obtained history,examined this patient, reviewed notes, independently viewed imaging studies, participated in medical decision making and plan of care.ROS completed by me personally and pertinent positives fully documented  I have made any additions or clarifications directly to the above note. Agree with note above.  Patient will Alzheimer's dementia presented with garbled speech and dysarthria with MRI showing subacute left thalamic lacunar infarct likely from small vessel disease.  Continue ongoing stroke work-up with aspirin and Plavix for 3 weeks followed by Plavix alone and aggressive risk factor modification.  Speech physical and Occupational Therapy consults.  Will likely need rehab.  No family available at the bedside.  Discussed with RN at the bedside.  Greater than 50% time during this 50-minute visit was spent in counseling and coordination of care about his personal stroke and discussion with care team and answering questions.  Antony Contras, MD Medical Director [facility]  Stroke Center Pager: [phone] 02/08/2021 3:00 PM   To contact Stroke Continuity provider, please refer to [email or web address] After hours, contact General Neurology

## 2021-02-08 NOTE — Progress Notes (Signed)
Assessment/Plan     Subjective: Still confused  Objective: Vital signs in last 24 hours: Temp:  [98.5 F (36.9 C)-98.7 F (37.1 C)] 98.6 F (37 C) (01/13 2008) Pulse Rate:  [57-106] 70 (01/14 0745) Resp:  [12-27] 17 (01/14 0745) BP: (105-223)/(53-139) 134/67 (01/14 0745) SpO2:  [13 %-100 %] 100 % (01/14 0745) Weight:  [63.5 kg] 63.5 kg (01/13 1304) Weight change:     Intake/Output from previous day: No intake/output data recorded. Intake/Output this shift: No intake/output data recorded.  General appearance: no distress and slowed mentation Head: Normocephalic, without obvious abnormality, atraumatic Neck: no adenopathy, no carotid bruit, no JVD, supple, symmetrical, trachea midline, and thyroid not enlarged, symmetric, no tenderness/mass/nodules Resp: clear to auscultation bilaterally and no wheezes rhonchi rales Cardio: regular rate and rhythm, S1, S2 normal, no murmur, click, rub or gallop GI: soft, non-tender; bowel sounds normal; no masses,  no organomegaly Extremities: extremities normal, atraumatic, no cyanosis or edema Pulses: 2+ and symmetric Skin: Skin color, texture, turgor normal. No rashes or lesions Neurologic: Mental status: Alert,  pleasantly confused Cranial nerves: normal  Lab Results: Recent Labs    02/07/21 1629 02/07/21 1734 02/08/21 0723  WBC 8.0  --  8.5  HGB 16.9 16.7 15.7  HCT 48.1 49.0 46.5  PLT 260  --  228   BMET Recent Labs    02/07/21 1629 02/07/21 1734 02/08/21 0723  NA 135 135 137  K 3.8 3.7 3.8  CL 99 100 100  CO2 23  --  22  GLUCOSE 103* 96 101*  BUN 8 9 12   CREATININE 0.97 0.80 1.00  CALCIUM 9.2  --  8.9    Studies/Results: MR BRAIN WO CONTRAST  Result Date: 02/07/2021 CLINICAL DATA:  Difficulty speaking and altered mental status EXAM: MRI HEAD WITHOUT CONTRAST TECHNIQUE: Multiplanar, multiecho pulse sequences of the brain and surrounding structures were obtained without intravenous contrast. COMPARISON:  Head CT  02/07/2021 FINDINGS: Brain: There is a small area of subacute ischemia in the left thalamus. No acute or chronic hemorrhage. Hyperintense T2-weighted signal is moderately widespread throughout the white matter. Advanced atrophy for age. There are multiple old small vessel infarcts of the basal ganglia and cerebellum. The midline structures are normal. Vascular: Major flow voids are preserved. Skull and upper cervical spine: Normal calvarium and skull base. Visualized upper cervical spine and soft tissues are normal. Sinuses/Orbits:No paranasal sinus fluid levels or advanced mucosal thickening. No mastoid or middle ear effusion. Normal orbits. IMPRESSION: 1. Small area of subacute ischemia in the left thalamus. No hemorrhage or mass effect. 2. Advanced atrophy and chronic ischemic microangiopathy. Electronically Signed   By: Ulyses Jarred M.D.   On: 02/07/2021 23:49   CT CEREBRAL PERFUSION W CONTRAST  Result Date: 02/07/2021 CLINICAL DATA:  Neuro deficit, acute, stroke suspected EXAM: CT PERFUSION BRAIN TECHNIQUE: Multiphase CT imaging of the brain was performed following IV bolus contrast injection. Subsequent parametric perfusion maps were calculated using RAPID software. RADIATION DOSE REDUCTION: This exam was performed according to the departmental dose-optimization program which includes automated exposure control, adjustment of the mA and/or kV according to patient size and/or use of iterative reconstruction technique. CONTRAST:  73mL OMNIPAQUE IOHEXOL 350 MG/ML SOLN COMPARISON:  None. FINDINGS: Some motion artifact is present at later time points. CBF (<30%) Volume: 47mL Perfusion (Tmax>6.0s) volume: 52mL Mismatch Volume: 68mL ASPECTS on noncontrast CT Head: 10 at 12:42 p.m. today. Infarct Core: 0 mL Infarction Location: None. IMPRESSION: No evidence of core infarction or territory at risk.  Electronically Signed   By: Macy Mis M.D.   On: 02/07/2021 14:20   CT HEAD CODE STROKE WO CONTRAST  Result  Date: 02/07/2021 CLINICAL DATA:  Code stroke.  Neuro deficit, acute, stroke suspected EXAM: CT HEAD WITHOUT CONTRAST TECHNIQUE: Contiguous axial images were obtained from the base of the skull through the vertex without intravenous contrast. RADIATION DOSE REDUCTION: This exam was performed according to the departmental dose-optimization program which includes automated exposure control, adjustment of the mA and/or kV according to patient size and/or use of iterative reconstruction technique. COMPARISON:  None. FINDINGS: Brain: There is no acute intracranial hemorrhage, mass effect, or edema. Gray-white differentiation is preserved. Patchy and confluent areas of hypoattenuation in the supratentorial white matter are nonspecific but probably reflect moderate chronic microvascular ischemic changes. There are chronic appearing small vessel infarcts of the right caudate and thalamus. Prominence of the ventricles and sulci reflects parenchymal volume loss. Vascular: No hyperdense vessel. There is intracranial atherosclerotic calcification at the skull base. Skull: Unremarkable. Sinuses/Orbits: No acute abnormality. Other: Mastoid air cells are clear. ASPECTS ([place] Stroke Program Early CT Score) - Ganglionic level infarction (caudate, lentiform nuclei, internal capsule, insula, M1-M3 cortex): 7 - Supraganglionic infarction (M4-M6 cortex): 3 Total score (0-10 with 10 being normal): 10 IMPRESSION: There is no acute intracranial hemorrhage or evidence of acute infarction. ASPECT score is 10. Chronic microvascular ischemic changes. 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 Preliminary results were communicated to Dr. Theda Sers at 12:46 pm on 02/07/2021 by text page via the [facility] messaging system. Electronically Signed   By: Macy Mis M.D.   On: 02/07/2021 12:48   CT ANGIO HEAD NECK W WO CM (CODE STROKE)  Result Date: 02/07/2021 CLINICAL DATA:  Neuro deficit, acute, stroke suspected EXAM:  CT ANGIOGRAPHY HEAD AND NECK TECHNIQUE: Multidetector CT imaging of the head and neck was performed using the standard protocol during bolus administration of intravenous contrast. Multiplanar CT image reconstructions and MIPs were obtained to evaluate the vascular anatomy. Carotid stenosis measurements (when applicable) are obtained utilizing NASCET criteria, using the distal internal carotid diameter as the denominator. RADIATION DOSE REDUCTION: This exam was performed according to the departmental dose-optimization program which includes automated exposure control, adjustment of the mA and/or kV according to patient size and/or use of iterative reconstruction technique. CONTRAST:  79mL OMNIPAQUE IOHEXOL 350 MG/ML SOLN COMPARISON:  None. FINDINGS: CTA NECK Aortic arch: Calcified plaque along the arch and patent great vessel origins. No high-grade proximal subclavian stenosis. Right carotid system: Patent. There is eccentric primarily noncalcified plaque along the mid common carotid with less than 50% stenosis. Primarily calcified plaque is present at the distal common carotid and proximal internal carotid with less than 50% stenosis. Left carotid system: Common carotid is patent with mixed plaque causing less than 50% stenosis. There is mixed plaque at the ICA origin with occlusion. No reconstitution in the neck. Vertebral arteries: Patent. Mixed plaque along the right V1 vertebral artery with marked stenosis. Focal marked stenosis the C3-C4 level. The left vertebral artery arises directly from the arch where there is plaque causing marked stenosis. Skeleton: Prior ACDF C4-C7.  Cervical spine degenerative changes. Other neck: Unremarkable. Upper chest: Emphysema. Review of the MIP images confirms the above findings CTA HEAD Anterior circulation: There is partial reconstitution of the left internal carotid beginning at the level of the clinoid. Intracranial right internal carotid artery is patent with calcified  plaque causing mild stenosis. Anterior cerebral arteries are patent. Anterior communicating artery  is present. Middle cerebral arteries are patent. Mild stenosis of the proximal right M1 MCA. Posterior circulation: Intracranial vertebral arteries are patent. Basilar artery is patent. Major cerebellar artery origins are patent. Posterior cerebral arteries are patent with atherosclerotic irregularity. There is moderate stenosis of the distal left P1 and distal to proximal right P1-P2 segments. Additional moderate stenosis of the distal left P2 segment. Venous sinuses: Patent as allowed by contrast bolus timing. 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 Partial reconstitution intracranially at the level of the clinoid. Plaque along the right common and internal carotid arteries causing less than 50% stenosis. Extracranial vertebral artery atherosclerosis. Plaque along the proximal vertebral arteries with marked stenosis. Additional focal marked stenosis on the right at the C3-C4 level. Mild stenosis proximal right M1 MCA. Moderate stenoses of the PCAs bilaterally. Preliminary results were communicated to Dr. Theda Sers at 1:03 pm on 02/07/2021 by text page via the [facility] messaging system. Electronically Signed   By: Macy Mis M.D.   On: 02/07/2021 13:19    Medications: I have reviewed the patient's current medications.  Assessment/Plan: 71 year old male with acute encephalopathy  #1.  Acute encephalopathy-MRI brain shows an subacute infarct.  Patient is already on treatment with aspirin.  Neuro consult is pending.  Echo is also pending.  No focal neurological deficits.  Continue with frequent neurological checks.  Further recommendations per neurology service.  CTA with no large vessel occlusion.  Very possibly will need short-term rehab.  #2.  Hypertension-allow permissive hypertension in the setting of subacute infarct as  above  #3.  Chronic tobacco abuse-noted  Further recommendations pending on overall hospital course results of how he does with physical therapy and neurology recommendations.      LOS: 0 days   Syliva Mee A 02/08/2021, 9:29 AM

## 2021-02-09 ENCOUNTER — Observation Stay (HOSPITAL_BASED_OUTPATIENT_CLINIC_OR_DEPARTMENT_OTHER): Payer: Medicare PPO

## 2021-02-09 DIAGNOSIS — G459 Transient cerebral ischemic attack, unspecified: Secondary | ICD-10-CM

## 2021-02-09 DIAGNOSIS — G9340 Encephalopathy, unspecified: Secondary | ICD-10-CM | POA: Diagnosis not present

## 2021-02-09 LAB — ECHOCARDIOGRAM COMPLETE
AR max vel: 1.67 cm2
AV Area VTI: 1.67 cm2
AV Area mean vel: 1.43 cm2
AV Mean grad: 2 mmHg
AV Peak grad: 2.6 mmHg
Ao pk vel: 0.81 m/s
Area-P 1/2: 2.99 cm2
Height: 65 in
S' Lateral: 2.7 cm
Weight: 2088.2 oz

## 2021-02-09 MED ORDER — ATORVASTATIN CALCIUM 40 MG PO TABS: 40.0000 mg | ORAL_TABLET | Freq: Every day | ORAL | 0 refills | Status: DC

## 2021-02-09 MED ORDER — CLOPIDOGREL BISULFATE 75 MG PO TABS: 75.0000 mg | ORAL_TABLET | Freq: Every day | ORAL | 0 refills | Status: DC

## 2021-02-09 NOTE — Progress Notes (Signed)
Nursing Discharge Note   Admit Date: 02/07/2021  Discharge date: 02/09/2021    Paul Fitzgerald. Is  to be discharged home per MD order.  AVS completed. Reviewed with patient and daughter Paul Fitzgerald  at bedside. Highlighted copy provided for daughter to take home to take home.  Patient is confused, but daughter/ Paul Fitzgerald is able to verbalize understanding of discharge instructions. PIV removed. Patient stable upon discharge.   Provided with paper copy of prescriptions for Plavix and Atorvastatin, daughter took it with her in discharge envelope.   Discharge Instructions   None     Allergies as of 02/09/2021   No Known Allergies      Medication List     TAKE these medications    aspirin EC 81 MG tablet Take 81 mg by mouth at bedtime. Swallow whole.   atorvastatin 40 MG tablet Commonly known as: LIPITOR Take 1 tablet (40 mg total) by mouth daily. Start taking on: February 10, 2021   clopidogrel 75 MG tablet Commonly known as: PLAVIX Take 1 tablet (75 mg total) by mouth daily. Start taking on: February 10, 2021        Discharge Instructions/ Education: Discharge instructions given to patient/family with verbalized understanding. Discharge education completed with patient/family including: follow up instructions, medication list, discharge activities, and limitations if indicated.  Daughter/ Paul Fitzgerald is able to verbalize understanding, all questions fully answered. Patient instructed to return to Emergency Department, call 911, or call MD for any changes in condition.  Patient escorted via wheelchair to lobby and discharged home via private automobile.

## 2021-02-09 NOTE — Care Management Obs Status (Signed)
MEDICARE OBSERVATION STATUS NOTIFICATION   Patient Details  Name: Paul Fitzgerald. MRN: [id number] Date of Birth: [birth year]   Medicare Observation Status Notification Given:  Yes    Lawerance Sabal, RN 02/09/2021, 8:58 AM

## 2021-02-09 NOTE — Discharge Summary (Signed)
Physician Discharge Summary  Patient ID: Paul Fitzgerald. MRN: [id number] DOB/AGE: Jun 01, 1950 71 y.o.  Admit date: 02/07/2021 Discharge date: 02/09/2021  Admission Diagnoses:  Discharge Diagnoses:  Principal Problem:   Acute encephalopathy Active Problems:   Hypertension   Tobacco abuse   Discharged Condition: good  Hospital Course: Paul Pruden. is a 70 y.o. male with medical history significant for essential hypertension, chronic tobacco abuse, TIA, who is admitted to [facility] on 02/07/2021 with acute encephalopathy concerning for acute ischemic CVA after presenting from home to [facility] ED for evaluation of confusion.   The following history is provided by the patient as well as the patient's daughter, in addition to my discussions with the EDP, and via chart review.   The patient was reportedly in his normal state of health this morning, when at approximately 8:15 AM on 02/07/2021, while talking with his daughter, became acutely confused and disoriented, forgetting the name of his daughter and also becoming unsure as to his current location.  There may have also been an element of speech slurring at that time as well, although this latter aspect subsequently resolved.   Denies any associated acute focal weakness, paresthesias, numbness, dysphagia, dizziness, vertigo, nausea, vomiting, change in vision, blurry vision, diplopia, word finding difficulties,facial droop, or headache.  Denies any associated chest pain, shortness of breath, palpitations, diaphoresis, presyncope, or syncope.   Patient reportedly has a history of TIA, but does not recall the nature of the symptoms that he was experiencing at the time of that diagnosis.  Medical history notable for essential hypertension, which appears to be visualized on medications, as the patient is not on any scheduled antihypertensive medications at home.  He also has a history of chronic tobacco abuse, noting that he is a current  smoker, having smoked half pack per day over at least the last 25 years.  Denies any routine alcohol consumption or use of recreational drugs.  Denies any history of known hyperlipidemia, diabetes, atrial fibrillation, obstructive sleep apnea.  Reports good compliance with his home antiplatelet regimen, which consists of daily baby aspirin.  States that he takes this in the context of a history of TIA, as above.  Not currently on any statin medication at home.  Mri found to have subacute infarct.  Neurology consulted.  Please see neuro notes for full details and imaging reports.  Echo results are pending at the time of discharge.  Plavix plus asa for 3 weeks then just plavix.  F/u neuro 2-4 weeks and PCP one week.  Discussed plan with children.  Cta no LVO.  By the time of discharge in his baseline status and ambulating well with underlying dementia.  Offered HH/PT but family declined stating they had plenty of help at home with family members to do their own PT.     Discharge Exam: Blood pressure (!) 157/67, pulse 78, temperature 98.2 F (36.8 C), temperature source Oral, resp. rate 16, weight 59.2 kg, SpO2 100 %. General appearance: alert and cooperative Neck: no adenopathy, no carotid bruit, no JVD, supple, symmetrical, trachea midline, and thyroid not enlarged, symmetric, no tenderness/mass/nodules Resp: clear to auscultation bilaterally Cardio: regular rate and rhythm, S1, S2 normal, no murmur, click, rub or gallop GI: soft, non-tender; bowel sounds normal; no masses,  no organomegaly Extremities: extremities normal, atraumatic, no cyanosis or edema Pulses: 2+ and symmetric Skin: Skin color, texture, turgor normal. No rashes or lesions  Disposition: Discharge disposition: 01-Home or Self Care  Discharge Instructions     Diet - low sodium heart healthy   Complete by: As directed    Discharge instructions   Complete by: As directed    Take aspirin with plavix for 3 weeks than  just plavix   Increase activity slowly   Complete by: As directed       Allergies as of 02/09/2021   No Known Allergies      Medication List     TAKE these medications    aspirin EC 81 MG tablet Take 81 mg by mouth at bedtime. Swallow whole.   atorvastatin 40 MG tablet Commonly known as: LIPITOR Take 1 tablet (40 mg total) by mouth daily. Start taking on: February 10, 2021   clopidogrel 75 MG tablet Commonly known as: PLAVIX Take 1 tablet (75 mg total) by mouth daily. Start taking on: February 10, 2021        Follow-up Information     [facility]. Call.   Why: Address: [address], [place], [place] [address] Phone: [phone]                Signed: Osric Klopf A 02/09/2021, 11:42 AM

## 2021-02-09 NOTE — Progress Notes (Signed)
Echocardiogram 2D Echocardiogram has been performed.  Paul Fitzgerald 02/09/2021, 10:10 AM

## 2021-02-09 NOTE — Plan of Care (Signed)
  Problem: Education: Goal: Knowledge of disease or condition will improve Outcome: Progressing Goal: Knowledge of secondary prevention will improve (SELECT ALL) Outcome: Progressing Goal: Knowledge of patient specific risk factors will improve (INDIVIDUALIZE FOR PATIENT) Outcome: Progressing Goal: Individualized Educational Video(s) Outcome: Progressing   Problem: Coping: Goal: Will verbalize positive feelings about self Outcome: Progressing Goal: Will identify appropriate support needs Outcome: Progressing   

## 2021-02-09 NOTE — Progress Notes (Addendum)
STROKE TEAM PROGRESS NOTE   Need echo to complete workup, started DAPT and atorvastatin   INTERVAL HISTORY  Right leg is weaker than left. He is able to follow commands. He is dysarthric and has garbled speech, moves all extremities antigravity. Hemodynamically stable, hgb 17.1, WBC 5.7, LDL 213.  MRI scan of the brain shows a subacute left thalamic lacunar infarct as well as old lacune's bilaterally.  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  Urine drug screen is negative.  LDL cholesterol is elevated at 213 mg percent and hemoglobin A1c is 5.2. DAPT and atorvastatin started.   Vitals:   02/08/21 1953 02/08/21 2334 02/09/21 0324 02/09/21 0351  BP: 138/74 (!) 138/57 (!) 134/54   Pulse: 89 76 77   Resp: 20 20 20    Temp: 97.8 F (36.6 C) 97.7 F (36.5 C) (!) 97.4 F (36.3 C)   TempSrc: Oral Axillary Axillary   SpO2: 100% 100% 98%   Weight:    59.2 kg   CBC:  Recent Labs  Lab 02/07/21 1308 02/07/21 1629 02/07/21 1734 02/08/21 0723  WBC 5.7 8.0  --  8.5  NEUTROABS 4.3 6.6  --   --   HGB 17.1* 16.9 16.7 15.7  HCT 48.0 48.1 49.0 46.5  MCV 92.3 92.0  --  94.9  PLT 229 260  --  228    Basic Metabolic Panel:  Recent Labs  Lab 02/07/21 1629 02/07/21 1734 02/08/21 0723  NA 135 135 137  K 3.8 3.7 3.8  CL 99 100 100  CO2 23  --  22  GLUCOSE 103* 96 101*  BUN 8 9 12   CREATININE 0.97 0.80 1.00  CALCIUM 9.2  --  8.9  MG  --   --  2.1    Lipid Panel:  Recent Labs  Lab 02/07/21 2002  CHOL 294*  TRIG 113  HDL 58  CHOLHDL 5.1  VLDL 23  LDLCALC *    HgbA1c:  Recent Labs  Lab 02/07/21 2003  HGBA1C 5.2    Urine Drug Screen:  Recent Labs  Lab 02/07/21 2250  LABOPIA NONE DETECTED  COCAINSCRNUR NONE DETECTED  LABBENZ NONE DETECTED  AMPHETMU NONE DETECTED  THCU NONE DETECTED  LABBARB NONE DETECTED     Alcohol Level  Recent Labs  Lab 02/07/21 1629  ETH <10     IMAGING past 24 hours No results found.  PHYSICAL EXAM  Physical Exam   Constitutional: Appears well-developed and well-nourished elderly male Cardiovascular: Normal rate and regular rhythm.  Respiratory: Effort normal, non-labored breathing  Neuro: Mental Status: Patient is awake and alert, conservative, oriented to self, otherwise confused.  Cranial Nerves: II: Pupils are equal, round, and reactive to light.  Slow to blink to threat on the right III,IV, VI: Left gaze preference V: Facial sensation is symmetric to temperature VII: Right facial droop VIII: Hearing is intact to voice X: Palate elevates symmetrically XI: Shoulder shrug is symmetric. XII: Tongue protrudes midline without atrophy or fasciculations.  Motor: Tone is normal. Bulk is normal. Moves antigravity with all extremities, right lower extremity weaker than left.  Mild right upper extremity weakness as well Sensory: Sensation is symmetric to light touch and temperature in the arms and legs. No extinction to DSS present.  Coordination: FNF intact bilaterally  ASSESSMENT/PLAN Mr. Hanif Radin. is a 71 y.o. male with history of Alzheimer's Dementia with behavioral Disturbance, HTN, TIAs presenting with garbled/incoherent speech after being found on the floor leaning against his bed by his  daughter. His daughter states that his diagnosis is that of vascular dementia and that he has had multiple TIAs of unknown symptomatology over the years. He is known to have aggressive behavior and per family, he is at risk for wandering and he can become aggressive. Triggers for his aggression are: women, wallets, keys, money. MRI shows left thalamic stroke likely due to small vessel disease source. Plan to start statin, aspirin, and plavix today. Echocardiogram shows EF 55-60% with normal LV function and mild thickening of the mitral valve.  Therapy currently recommending home health PT.   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 Code Stroke significant  generalized/central atrophy with ex-vacuo dilatation of ventricles. No acute ischemia noted. Chronic appearing right caudate and thalamic ischemia noted    CTA head & neck- Proximal left ICA occlusion with reconstitution of distal flow CT perfusion CBF vol 0, perfusion 0, mismatch thus 0 : no infarct core nor penumbra ascertained  MRI  Small area of subacute ischemia in the left thalamus. No hemorrhage or mass effect. 2D Echo - EF 55-60%, LV normal function. Mild mitral valve thickening LDL 213 HgbA1c 5.2 VTE prophylaxis - SCDs aspirin 81 mg daily prior to admission, now on aspirin 81 mg daily and clopidogrel 75 mg daily.  Therapy recommendations:  Home health PT Disposition:  pending  Hypertension Home meds:  None Stable Permissive hypertension (OK if < 220/120) but gradually normalize in 5-7 days Long-term BP goal normotensive PRN labetolol 10mg    Hyperlipidemia Home meds:  None LDL 213, goal < 70 Add Atorvastatin 40mg   Continue statin at discharge  Other Stroke Risk Factors Advanced Age >/= 28  Cigarette smoker, advised to stop smoking 1ppd for 25 years Hx stroke/TIA Hx TIA- unknown symptoms  Other Active Problems Vascular dementia?  Diagnosed per daughter, no outside records available.   Hospital day # 0  Patient seen and examined by NP/APP with MD. MD to update note as needed.   , DNP, FNP-BC [facility] Pager: [phone]  02/09/2021 8:27 AM Patient is doing well and is likely going to be discharged home today.  Follow-up as an outpatient stroke clinic in 2 months.  Recommend aspirin and Plavix for 3 weeks followed by Plavix alone and aggressive risk factor modification. [phone], MD  To contact Stroke Continuity provider, please refer to 02/11/2021. After hours, contact General Neurology

## 2021-02-09 NOTE — Evaluation (Signed)
Occupational Therapy Evaluation Patient Details Name: Paul Fitzgerald. MRN: [id number] DOB: 16-Oct-1950 Today's Date: 02/09/2021   History of Present Illness Pt is Paul 71 y.o. M who presents with acute onset speech abnormalities. CTA revealed left ICA occlusion with distal flow reconstitution at the level of the clinoid. MRI showing small area of subacute ischemia in the left thalamus. Significant PMH: Alzheimer's dementia, TIA, HTN.   Clinical Impression   Per chart, Paul Fitzgerald required some cognitive and physical assist for ADLs PTA but ambulated independently. He lives with his family who are able to provide 24/7 assist. Upon evaluation pt was able to follow most simple commands with increased time and cues, he has expressive communication difficulties with slurring and word finding impairments. Overall, pt requires up to mod Paul for ADLs and min Paul for functional mobility. He has some R weakness and incoordination with impaired balance. Pt will benefit from continued OT acutely to progress his safety and function. Recommend pt d/c home with HHOT, pt's family declining post acute rehab at this time.      Recommendations for follow up therapy are one component of Paul multi-disciplinary discharge planning process, led by the attending physician.  Recommendations may be updated based on patient status, additional functional criteria and insurance authorization.   Follow Up Recommendations  Home health OT (Pts family declines post acute rehab & would like pt to return to familiar environment asap.)    Assistance Recommended at Discharge Frequent or constant Supervision/Assistance  Patient can return home with the following Paul little help with walking and/or transfers;Paul little help with bathing/dressing/bathroom;Assistance with cooking/housework;Direct supervision/assist for medications management;Assist for transportation;Help with stairs or ramp for entrance    Functional Status Assessment  Patient has had Paul  recent decline in their functional status and demonstrates the ability to make significant improvements in function in Paul reasonable and predictable amount of time.  Equipment Recommendations  Tub/shower seat (RW)    Recommendations for Other Services       Precautions / Restrictions Precautions Precautions: Fall Restrictions Weight Bearing Restrictions: No      Mobility Bed Mobility Overal bed mobility: Needs Assistance Bed Mobility: Supine to Sit     Supine to sit: Min assist          Transfers Overall transfer level: Needs assistance Equipment used: None Transfers: Sit to/from Stand Sit to Stand: Min assist           General transfer comment: unsteady once standing, required min Paul for balance      Balance Overall balance assessment: Needs assistance Sitting-balance support: Feet supported Sitting balance-Leahy Scale: Fair Sitting balance - Comments: able to sit EOB to don bilat socks   Standing balance support: No upper extremity supported Standing balance-Leahy Scale: Poor Standing balance comment: requires min Paul                           ADL either performed or assessed with clinical judgement   ADL Overall ADL's : Needs assistance/impaired Eating/Feeding: NPO   Grooming: Set up;Supervision/safety;Sitting   Upper Body Bathing: Minimal assistance;Sitting   Lower Body Bathing: Moderate assistance;Sit to/from stand Lower Body Bathing Details (indicate cue type and reason): able to place socks on bilat feet, required assist to fully don Upper Body Dressing : Set up;Supervision/safety;Sitting   Lower Body Dressing: Moderate assistance;Sit to/from stand   Toilet Transfer: Minimal assistance;Ambulation   Toileting- Clothing Manipulation and Hygiene: Min guard;Sitting/lateral lean  Functional mobility during ADLs: Minimal assistance General ADL Comments: pt limited by cognition, poor balance and coordination when OOB. No AD used this  session however would likely improve stability     Vision Baseline Vision/History: 0 No visual deficits Ability to See in Adequate Light: 0 Adequate Additional Comments: difficutl to fully assess - pt with poor command following for visual assessment            Pertinent Vitals/Pain Pain Assessment: No/denies pain     Hand Dominance     Extremity/Trunk Assessment Upper Extremity Assessment Upper Extremity Assessment: Overall WFL for tasks assessed;Difficult to assess due to impaired cognition   Lower Extremity Assessment Lower Extremity Assessment: Defer to PT evaluation   Cervical / Trunk Assessment Cervical / Trunk Assessment: Kyphotic   Communication Communication Communication: Expressive difficulties (slurred speech, mild word finding)   Cognition Arousal/Alertness: Awake/alert Behavior During Therapy: Flat affect Overall Cognitive Status: History of cognitive impairments - at baseline       General Comments: hx of dementia. Pt oriented to self and place, "july" for month. He stated that he works 40 hrs/wk, and is indep. Per chart information, that is inaccurate. He is able to follow simple commands with increased time     General Comments  VSS on RA            Home Living Family/patient expects to be discharged to:: Private residence Living Arrangements: Children Available Help at Discharge: Family;Available 24 hours/day Type of Home: House Home Access: Level entry     Home Layout: One level     Bathroom Shower/Tub: Tub/shower unit         Home Equipment: Cane - single point;Hospital bed          Prior Functioning/Environment Prior Level of Function : Needs assist             Mobility Comments: independent ADLs Comments: daughter reports pt requires set up-min assist for feeding, assist for bathing, dressing        OT Problem List: Decreased strength;Decreased range of motion;Decreased activity tolerance;Impaired balance (sitting  and/or standing);Decreased coordination;Decreased cognition;Decreased safety awareness;Decreased knowledge of use of DME or AE;Decreased knowledge of precautions;Pain      OT Treatment/Interventions: Self-care/ADL training;Therapeutic exercise;Balance training;Patient/family education;DME and/or AE instruction;Therapeutic activities    OT Goals(Current goals can be found in the care plan section) Acute Rehab OT Goals Patient Stated Goal: did not state OT Goal Formulation: With patient Time For Goal Achievement: 02/23/21 Potential to Achieve Goals: Good ADL Goals Pt Will Perform Grooming: with modified independence;standing Pt Will Perform Lower Body Bathing: with modified independence;sit to/from stand Pt Will Perform Lower Body Dressing: with modified independence;sit to/from stand Pt Will Transfer to Toilet: with modified independence;ambulating  OT Frequency: Min 2X/week       AM-PAC OT "6 Clicks" Daily Activity     Outcome Measure Help from another person eating meals?: Total Help from another person taking care of personal grooming?: Paul Little Help from another person toileting, which includes using toliet, bedpan, or urinal?: Paul Little Help from another person bathing (including washing, rinsing, drying)?: Paul Lot Help from another person to put on and taking off regular upper body clothing?: Paul Little Help from another person to put on and taking off regular lower body clothing?: Paul Lot 6 Click Score: 14   End of Session Equipment Utilized During Treatment: Gait belt Nurse Communication: Mobility status  Activity Tolerance: Patient tolerated treatment well Patient left: in chair;with call bell/phone within  reach;with chair alarm set  OT Visit Diagnosis: Unsteadiness on feet (R26.81);Other abnormalities of gait and mobility (R26.89);Hemiplegia and hemiparesis Hemiplegia - Right/Left: Right Hemiplegia - dominant/non-dominant: Dominant Hemiplegia - caused by: Other cerebrovascular  disease                Time: 9357-0177 OT Time Calculation (min): 17 min Charges:  OT General Charges $OT Visit: 1 Visit OT Evaluation $OT Eval Moderate Complexity: 1 Mod   Paul Fitzgerald Paul Fitzgerald 02/09/2021, 9:56 AM

## 2021-02-09 NOTE — TOC Transition Note (Signed)
Transition of Care [facility]) - CM/SW Discharge Note   Patient Details  Name: Paul Fitzgerald. MRN: [id number] Date of Birth: [birth year]  [facility] CM/SW Contact:  Lawerance Sabal, RN Phone Number: 02/09/2021, 11:55 AM   Clinical Narrative:    Sherron Monday w patient's daughter, she declines HH services and DME. Patient will DC to home, with daughter for care and support.  Roland Earl [phone]    Final next level of care: Home/Self Care Barriers to Discharge: No Barriers Identified   Patient Goals and CMS Choice Patient states their goals for this hospitalization and ongoing recovery are:: to go home w daughter, per daughter strickland,ashley   Choice offered to / list presented to : NA  Discharge Placement                       Discharge Plan and Services                  DME Agency: NA         HH Agency: NA        Social Determinants of Health (SDOH) Interventions     Readmission Risk Interventions No flowsheet data found.

## 2021-02-10 LAB — CBG MONITORING, ED: Glucose-Capillary: 76 mg/dL (ref 70–99)

## 2021-04-02 ENCOUNTER — Inpatient Hospital Stay
Admission: EM | Admit: 2021-04-02 | Discharge: 2021-04-05 | DRG: 682 | Disposition: A | Discharge status: Hospice | Payer: Medicare PPO | Attending: Internal Medicine | Admitting: Internal Medicine

## 2021-04-02 ENCOUNTER — Other Ambulatory Visit: Payer: Self-pay

## 2021-04-02 ENCOUNTER — Emergency Department: Payer: Medicare PPO

## 2021-04-02 DIAGNOSIS — N179 Acute kidney failure, unspecified: Secondary | ICD-10-CM | POA: Diagnosis not present

## 2021-04-02 DIAGNOSIS — K5900 Constipation, unspecified: Secondary | ICD-10-CM | POA: Diagnosis present

## 2021-04-02 DIAGNOSIS — F17210 Nicotine dependence, cigarettes, uncomplicated: Secondary | ICD-10-CM | POA: Diagnosis present

## 2021-04-02 DIAGNOSIS — R000 Tachycardia, unspecified: Secondary | ICD-10-CM | POA: Diagnosis present

## 2021-04-02 DIAGNOSIS — E878 Other disorders of electrolyte and fluid balance, not elsewhere classified: Secondary | ICD-10-CM | POA: Diagnosis present

## 2021-04-02 DIAGNOSIS — Z7982 Long term (current) use of aspirin: Secondary | ICD-10-CM | POA: Diagnosis not present

## 2021-04-02 DIAGNOSIS — I10 Essential (primary) hypertension: Secondary | ICD-10-CM | POA: Diagnosis present

## 2021-04-02 DIAGNOSIS — F0390 Unspecified dementia without behavioral disturbance: Secondary | ICD-10-CM | POA: Diagnosis present

## 2021-04-02 DIAGNOSIS — D7589 Other specified diseases of blood and blood-forming organs: Secondary | ICD-10-CM | POA: Diagnosis present

## 2021-04-02 DIAGNOSIS — E43 Unspecified severe protein-calorie malnutrition: Secondary | ICD-10-CM | POA: Diagnosis present

## 2021-04-02 DIAGNOSIS — Z515 Encounter for palliative care: Secondary | ICD-10-CM | POA: Diagnosis not present

## 2021-04-02 DIAGNOSIS — R627 Adult failure to thrive: Secondary | ICD-10-CM | POA: Diagnosis present

## 2021-04-02 DIAGNOSIS — Z681 Body mass index (BMI) 19 or less, adult: Secondary | ICD-10-CM | POA: Diagnosis not present

## 2021-04-02 DIAGNOSIS — E870 Hyperosmolality and hypernatremia: Secondary | ICD-10-CM | POA: Diagnosis present

## 2021-04-02 DIAGNOSIS — Z20822 Contact with and (suspected) exposure to covid-19: Secondary | ICD-10-CM | POA: Diagnosis present

## 2021-04-02 DIAGNOSIS — Z66 Do not resuscitate: Secondary | ICD-10-CM | POA: Diagnosis present

## 2021-04-02 DIAGNOSIS — Z79899 Other long term (current) drug therapy: Secondary | ICD-10-CM | POA: Diagnosis not present

## 2021-04-02 DIAGNOSIS — F03C0 Unspecified dementia, severe, without behavioral disturbance, psychotic disturbance, mood disturbance, and anxiety: Secondary | ICD-10-CM | POA: Diagnosis present

## 2021-04-02 DIAGNOSIS — E162 Hypoglycemia, unspecified: Secondary | ICD-10-CM | POA: Diagnosis present

## 2021-04-02 DIAGNOSIS — R4182 Altered mental status, unspecified: Secondary | ICD-10-CM

## 2021-04-02 DIAGNOSIS — Z7189 Other specified counseling: Secondary | ICD-10-CM | POA: Diagnosis not present

## 2021-04-02 DIAGNOSIS — Z7401 Bed confinement status: Secondary | ICD-10-CM

## 2021-04-02 DIAGNOSIS — Z7902 Long term (current) use of antithrombotics/antiplatelets: Secondary | ICD-10-CM | POA: Diagnosis not present

## 2021-04-02 DIAGNOSIS — Z8673 Personal history of transient ischemic attack (TIA), and cerebral infarction without residual deficits: Secondary | ICD-10-CM

## 2021-04-02 DIAGNOSIS — D72829 Elevated white blood cell count, unspecified: Secondary | ICD-10-CM | POA: Diagnosis present

## 2021-04-02 DIAGNOSIS — E860 Dehydration: Secondary | ICD-10-CM | POA: Diagnosis present

## 2021-04-02 LAB — CBC WITH DIFFERENTIAL/PLATELET
Abs Immature Granulocytes: 0.07 10*3/uL (ref 0.00–0.07)
Basophils Absolute: 0 10*3/uL (ref 0.0–0.1)
Basophils Relative: 0 %
Eosinophils Absolute: 0.1 10*3/uL (ref 0.0–0.5)
Eosinophils Relative: 1 %
HCT: 48.6 % (ref 39.0–52.0)
Hemoglobin: 14.9 g/dL (ref 13.0–17.0)
Immature Granulocytes: 1 %
Lymphocytes Relative: 9 %
Lymphs Abs: 1.1 10*3/uL (ref 0.7–4.0)
MCH: 32.3 pg (ref 26.0–34.0)
MCHC: 30.7 g/dL (ref 30.0–36.0)
MCV: 105.4 fL — ABNORMAL HIGH (ref 80.0–100.0)
Monocytes Absolute: 0.5 10*3/uL (ref 0.1–1.0)
Monocytes Relative: 4 %
Neutro Abs: 10.5 10*3/uL — ABNORMAL HIGH (ref 1.7–7.7)
Neutrophils Relative %: 85 %
Platelets: 151 10*3/uL (ref 150–400)
RBC: 4.61 MIL/uL (ref 4.22–5.81)
RDW: 13.8 % (ref 11.5–15.5)
WBC: 12.3 10*3/uL — ABNORMAL HIGH (ref 4.0–10.5)
nRBC: 0.2 % (ref 0.0–0.2)

## 2021-04-02 LAB — URINALYSIS, ROUTINE W REFLEX MICROSCOPIC
Bacteria, UA: NONE SEEN
Bilirubin Urine: NEGATIVE
Glucose, UA: 50 mg/dL — AB
Hgb urine dipstick: NEGATIVE
Ketones, ur: NEGATIVE mg/dL
Leukocytes,Ua: NEGATIVE
Nitrite: NEGATIVE
Protein, ur: 30 mg/dL — AB
Specific Gravity, Urine: 1.027 (ref 1.005–1.030)
Squamous Epithelial / HPF: NONE SEEN (ref 0–5)
pH: 5 (ref 5.0–8.0)

## 2021-04-02 LAB — COMPREHENSIVE METABOLIC PANEL
ALT: 40 U/L (ref 0–44)
AST: 46 U/L — ABNORMAL HIGH (ref 15–41)
Albumin: 2.7 g/dL — ABNORMAL LOW (ref 3.5–5.0)
Alkaline Phosphatase: 93 U/L (ref 38–126)
BUN: 55 mg/dL — ABNORMAL HIGH (ref 8–23)
CO2: 25 mmol/L (ref 22–32)
Calcium: 8.3 mg/dL — ABNORMAL LOW (ref 8.9–10.3)
Chloride: >130 mmol/L (ref 98–111)
Creatinine, Ser: 1.4 mg/dL — ABNORMAL HIGH (ref 0.61–1.24)
GFR, Estimated: 53 mL/min — ABNORMAL LOW (ref >60)
Glucose, Bld: 157 mg/dL — ABNORMAL HIGH (ref 70–99)
Potassium: 3.3 mmol/L — ABNORMAL LOW (ref 3.5–5.1)
Sodium: 168 mmol/L (ref 135–145)
Total Bilirubin: 1.1 mg/dL (ref 0.3–1.2)
Total Protein: 7 g/dL (ref 6.5–8.1)

## 2021-04-02 LAB — RESP PANEL BY RT-PCR (FLU A&B, COVID) ARPGX2
Influenza A by PCR: NEGATIVE
Influenza B by PCR: NEGATIVE
SARS Coronavirus 2 by RT PCR: NEGATIVE

## 2021-04-02 LAB — CBG MONITORING, ED
Glucose-Capillary: 45 mg/dL — ABNORMAL LOW (ref 70–99)
Glucose-Capillary: 151 mg/dL — ABNORMAL HIGH (ref 70–99)

## 2021-04-02 IMAGING — CT CT CERVICAL SPINE W/O CM
4 of 8 series · 11 of 35 positions shown, 12 images · non-contrast
Comparison: None.

CLINICAL DATA: Declining mental function.



[Series 3: c spine soft · axial · 0.32mm/px · z∈[+397,+463]mm · 2 of 101 slices shown]
[im 34/101  soft-tissue]
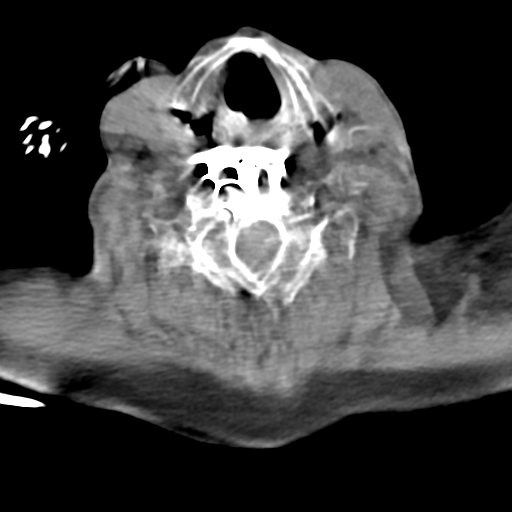
[im 67/101  soft-tissue]
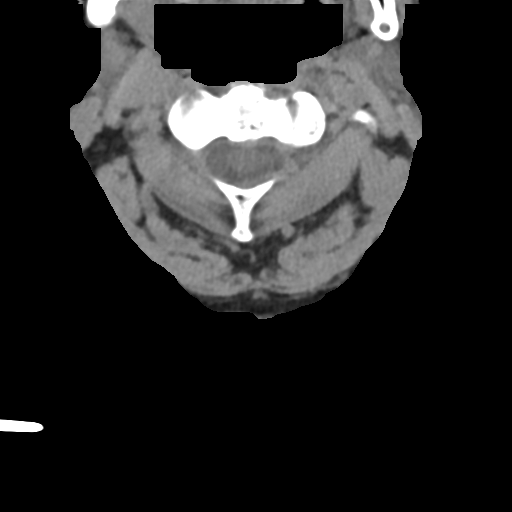

[Series 5: coronal bone · coronal · 0.29mm/px · 1 of 58 slices shown]
[im 29/58  bone]
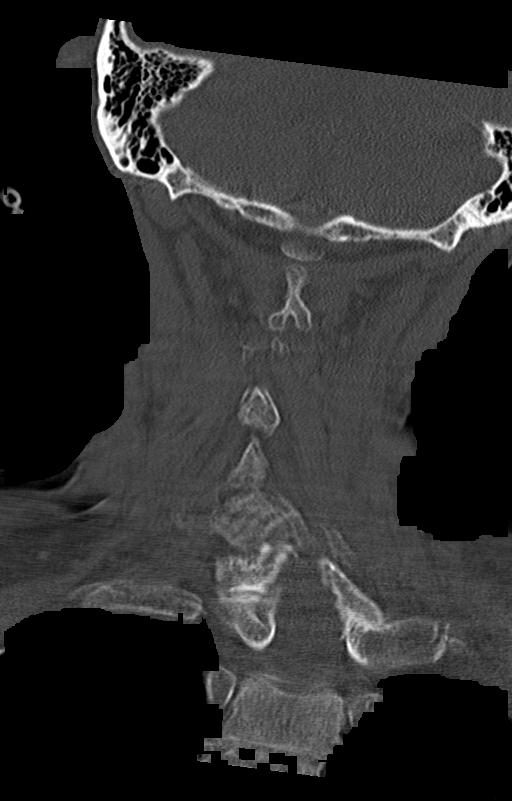

[Series 6: orthogonal bone · axial · 0.28mm/px · z∈[+361,+431]mm · 2 of 111 slices shown, 3 images]
[im 37/111  soft-tissue]
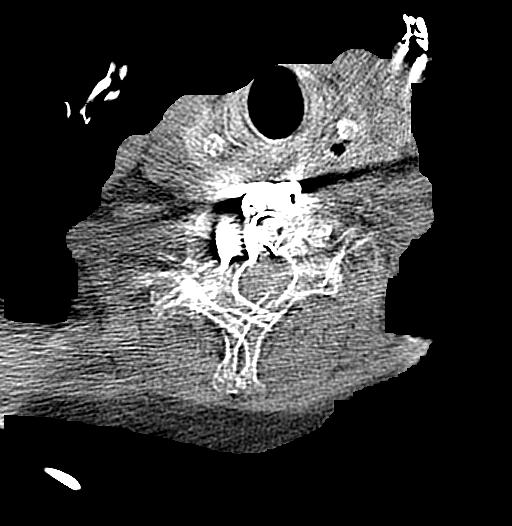
[im 37/111  bone]
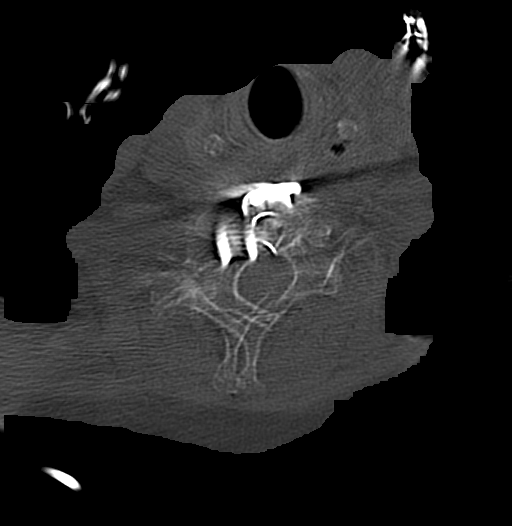
[im 74/111  bone]
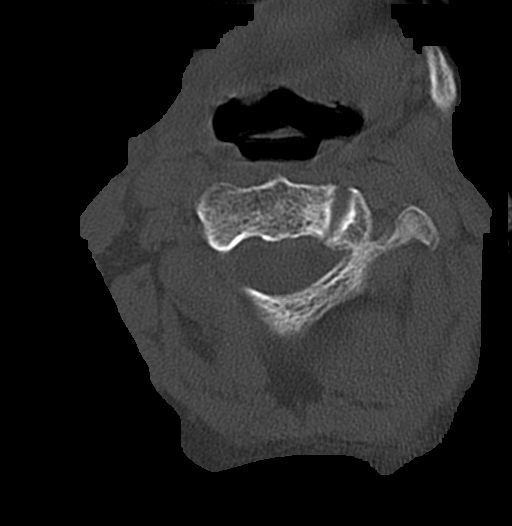

[Series 11: sagittal bone · sagittal · 0.33mm/px · 6 of 61 slices shown]
[im 11/61  bone]
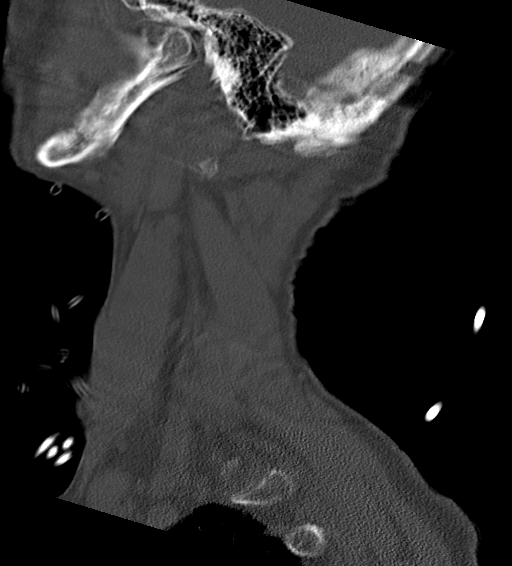
[im 21/61  bone]
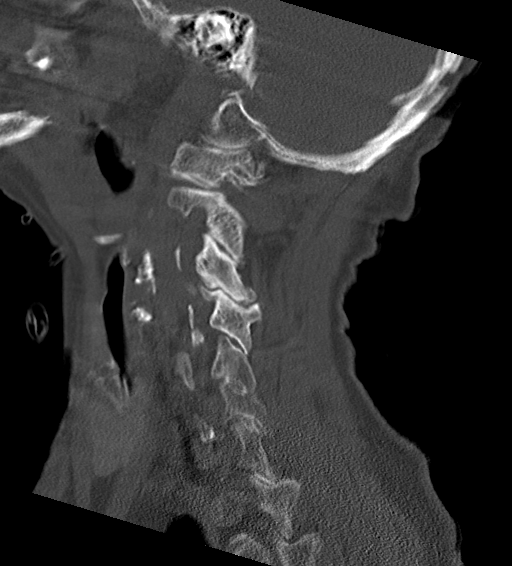
[im 31/61  bone]
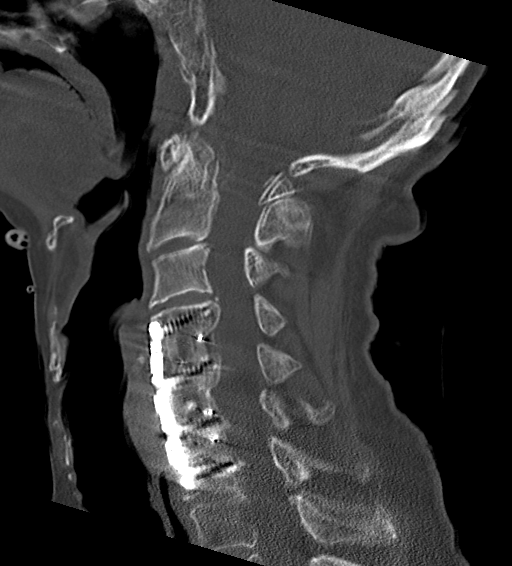
[im 41/61  bone]
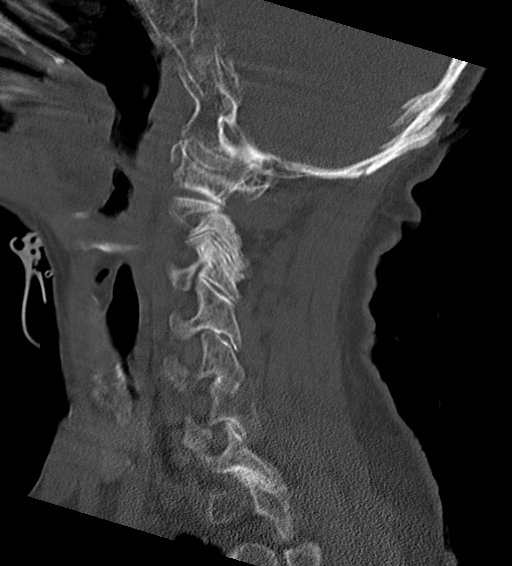
[im 46/61  soft-tissue]
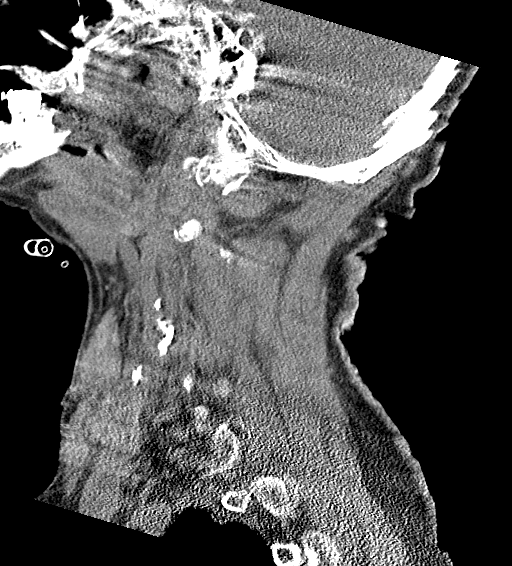
[im 51/61  bone]
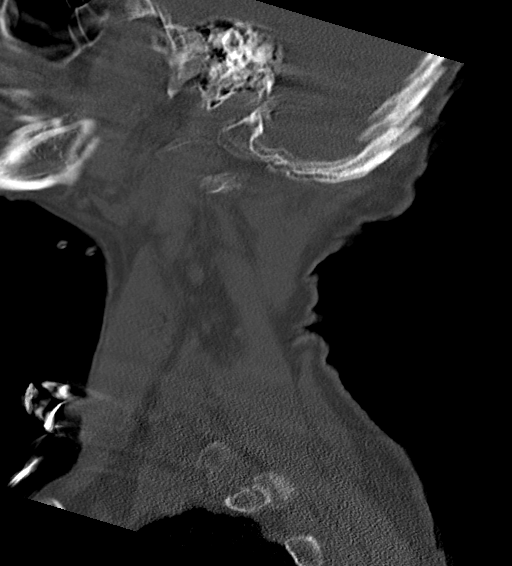

[11 of 35 positions shown; findings below may reference images not displayed]

FINDINGS: Alignment: Normal.

Skull base and vertebrae: No acute fracture. A metallic density
fusion plate and screws are seen along the anterior aspects of the
C4, C5, C6 and C7.

Soft tissues and spinal canal: No prevertebral fluid or swelling. No
visible canal hematoma.

Disc levels: Mild endplate sclerosis is seen at the levels of C2-C3
and C3-C4.

There is moderate severity narrowing of the anterior atlantoaxial
articulation. Anterior surgical fusion is seen at the levels of
C4-C5, C5-C6 and C6-C7.

Bilateral marked severity facet joint hypertrophy is seen at the
levels of C1-C2 and C2-C3.

Upper chest: Negative.

Other: The study is markedly limited secondary to patient motion.
IMPRESSION: 1. Markedly limited secondary to patient motion.
2. No acute cervical spine fracture or subluxation.
3. Degenerative changes with anterior surgical fusion at the levels
of C4-C5, C5-C6 and C6-C7.

## 2021-04-02 IMAGING — CT CT HEAD W/O CM
5 of 8 series · 17 of 47 positions shown, 18 images · non-contrast
Comparison: [DATE]

CLINICAL DATA: Altered mental status.



[Series 2: head wo · axial · 0.41mm/px · z∈[+550,+600]mm · 2 of 32 slices shown, 3 images (1 of 2)]
[im 11/32  brain]
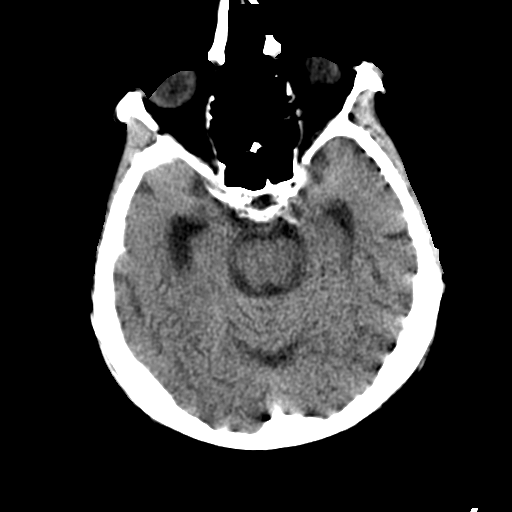
[im 11/32  bone]
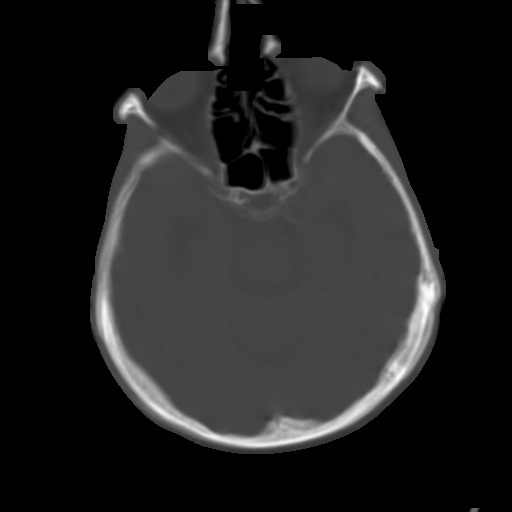
[im 21/32  brain]
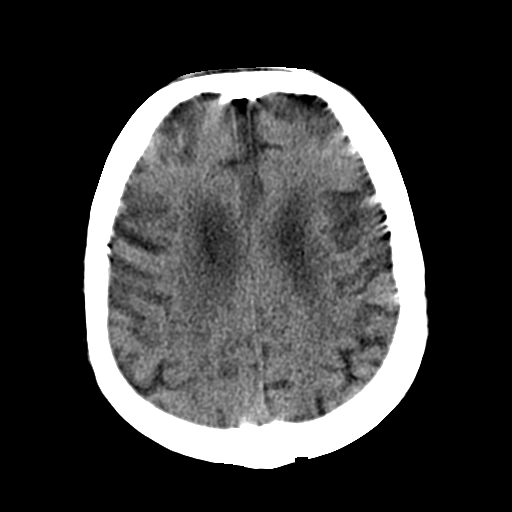

[Series 3: head bone · axial · 0.41mm/px · z∈[+514,+642]mm · 8 of 80 slices shown]
[im 8/80  bone]
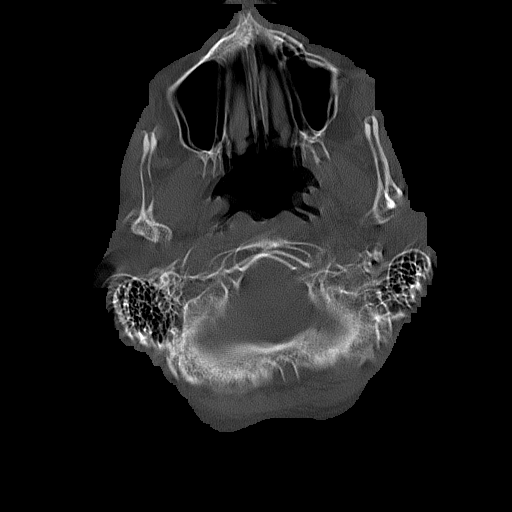
[im 16/80  bone]
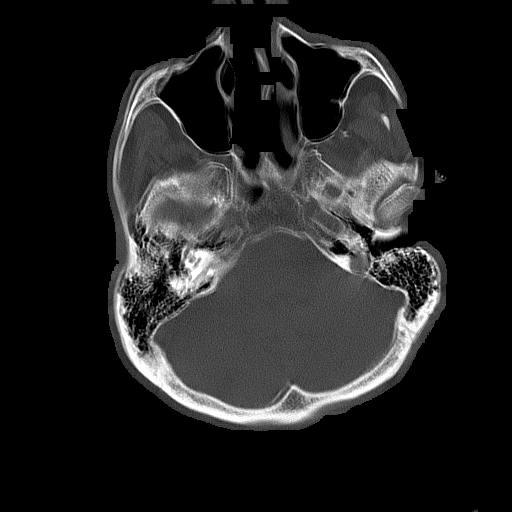
[im 24/80  bone]
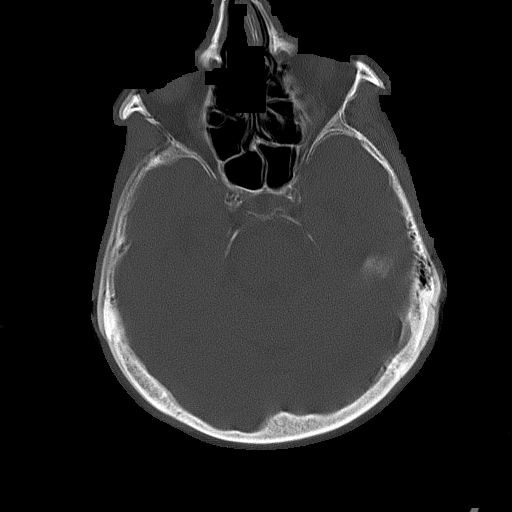
[im 32/80  bone]
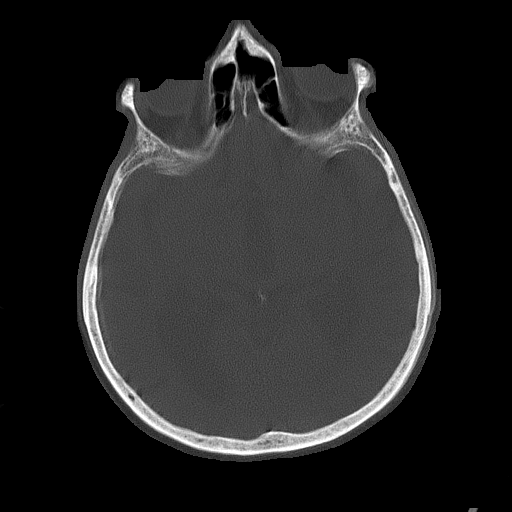
[im 48/80  bone]
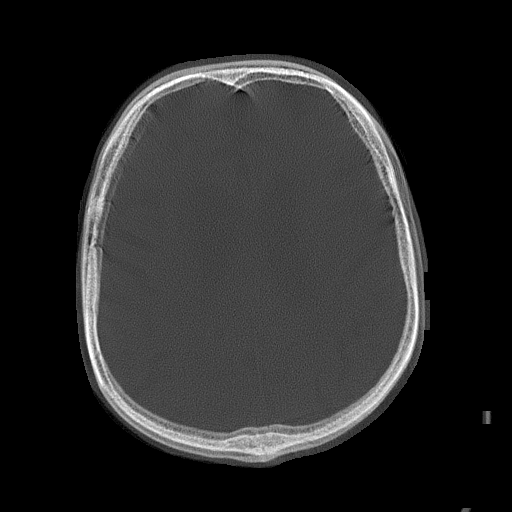
[im 56/80  bone]
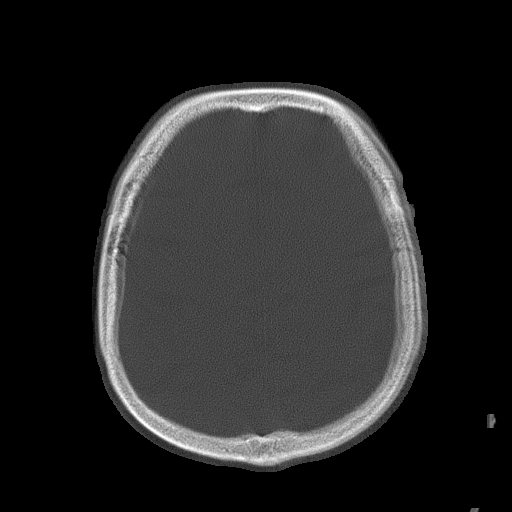
[im 64/80  bone]
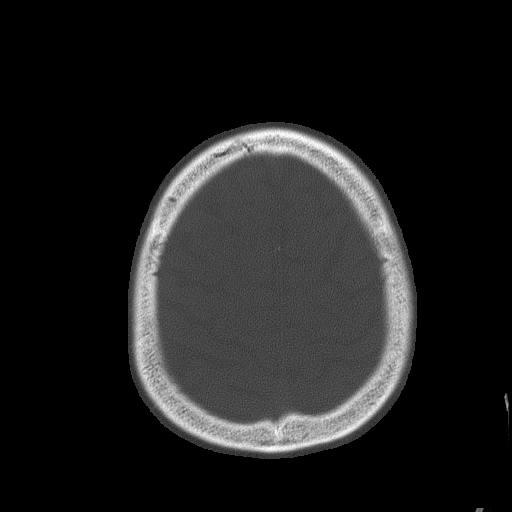
[im 72/80  bone]
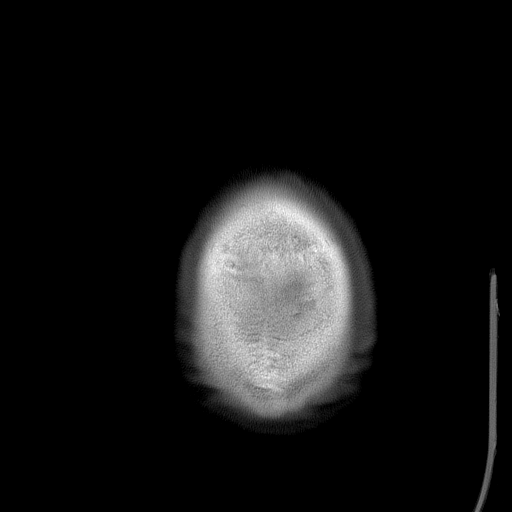

[Series 4: coronal soft tissue · coronal · 0.31mm/px · 3 of 62 slices shown]
[im 16/62  brain]
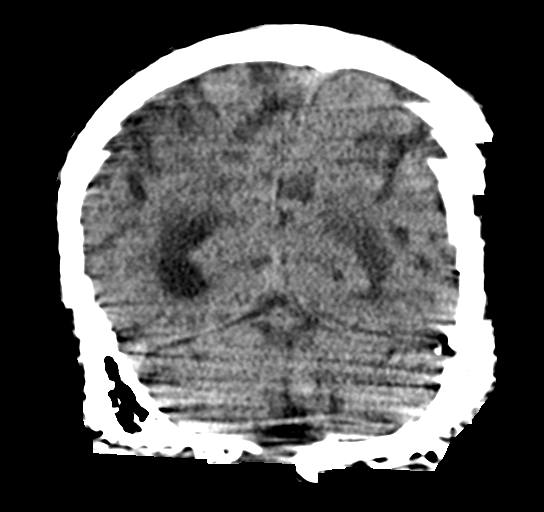
[im 31/62  brain]
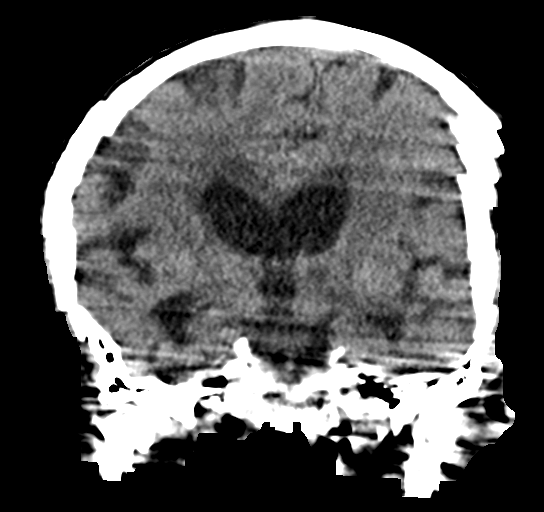
[im 46/62  brain]
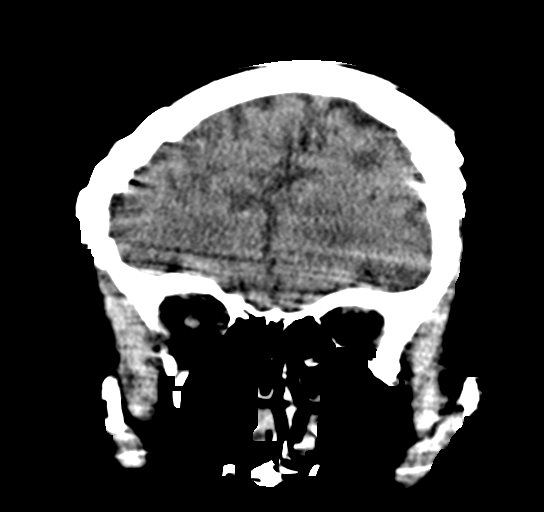

[Series 6: head wo · axial · 0.41mm/px · z∈[+550,+600]mm · 2 of 32 slices shown (2 of 2)]
[im 11/32  brain]
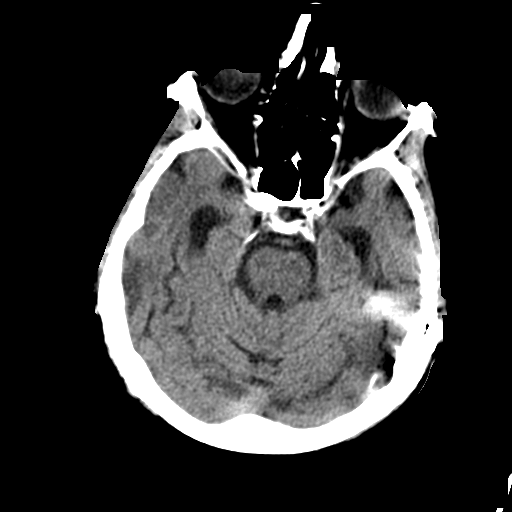
[im 21/32  brain]
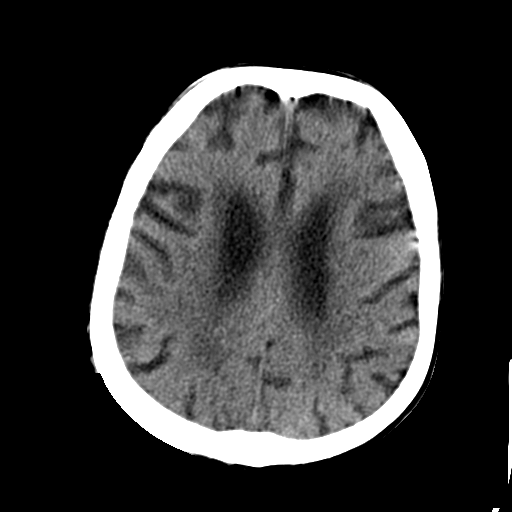

[Series 9: sagittal soft tissue · sagittal · 0.31mm/px · 2 of 56 slices shown]
[im 19/56  brain]
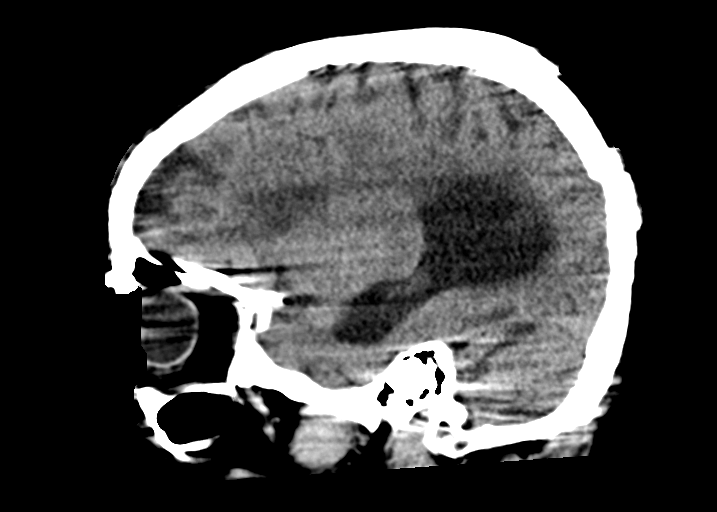
[im 37/56  brain]
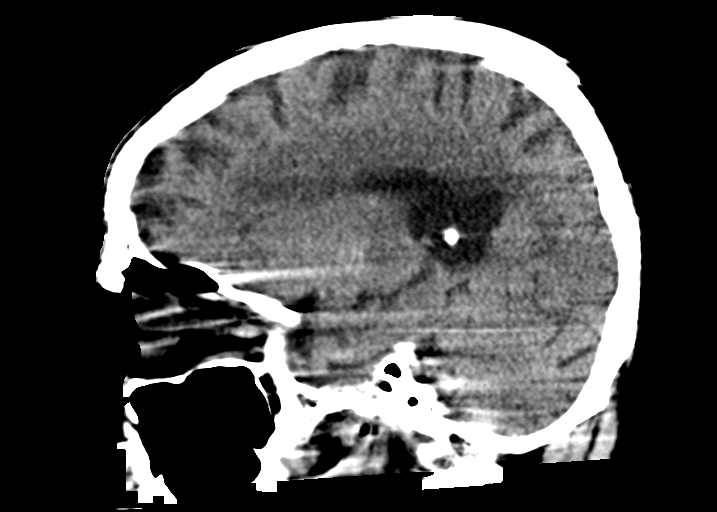

[17 of 47 positions shown; findings below may reference images not displayed]

FINDINGS: Brain: There is mild cerebral atrophy with widening of the
extra-axial spaces and ventricular dilatation.
There are areas of decreased attenuation within the white matter
tracts of the supratentorial brain, consistent with microvascular
disease changes.

Chronic bilateral basal ganglia lacunar infarcts are seen.

Vascular: No hyperdense vessel or unexpected calcification.

Skull: Normal. Negative for fracture or focal lesion.

Sinuses/Orbits: No acute finding.

Other: The study is limited secondary to patient motion.
IMPRESSION: 1. Generalized cerebral atrophy.
2. No acute intracranial abnormality.

## 2021-04-02 MED ORDER — SODIUM CHLORIDE 0.9 % IV SOLN: INTRAVENOUS | Status: DC

## 2021-04-02 MED ORDER — DEXTROSE 10 % IV SOLN: INTRAVENOUS | Status: DC

## 2021-04-02 MED ORDER — DEXTROSE 50 % IV SOLN
INTRAVENOUS | Status: AC
  Filled 2021-04-02: qty 50

## 2021-04-02 NOTE — ED Triage Notes (Signed)
Pt to ED via EMS from home, per family pt has hx of dementia and had a stroke back in January and since pt has had decline in function, more so over past few days pt has not been eating and drinking as much.  ?

## 2021-04-02 NOTE — ED Provider Notes (Signed)
[facility] Provider Note    Event Date/Time   First MD Initiated Contact with Patient 04/02/21 2033     (approximate)   History   Altered Mental Status   HPI  Paul Fitzgerald. is a 71 y.o. male with history of hypertension, CVA, dementia who presents to the emergency department EMS for concerns for altered mental status and decreased p.o. intake over the past 2 days.  History is obtained from EMS and patient's son-in-law as patient has severe dementia and is unable to answer questions.  Family reports since his stroke in January he has been bedbound.  They report he has had several falls out of bed over the past week, last being about 4 to 5 days ago.  He is on Plavix.  They did not come to the emergency department for those episodes.  They deny any known fevers, cough, vomiting, diarrhea.  No other changes in medications.   History provided by son-in-law, EMS, level 5 caveat secondary to patient's dementia.    Past Medical History:  Diagnosis Date   Hypertension    TIA (transient ischemic attack)     History reviewed. No pertinent surgical history.  MEDICATIONS:  Prior to Admission medications   Medication Sig Start Date End Date Taking? Authorizing Provider  aspirin EC 81 MG tablet Take 81 mg by mouth at bedtime. Swallow whole.    [provider]  atorvastatin (LIPITOR) 40 MG tablet Take 1 tablet (40 mg total) by mouth daily. 02/10/21   Phillips Grout, MD  clopidogrel (PLAVIX) 75 MG tablet Take 1 tablet (75 mg total) by mouth daily. 02/10/21   Phillips Grout, MD    Physical Exam   Triage Vital Signs: ED Triage Vitals  Enc Vitals Group     BP 04/02/21 2037 (!) 152/66     Pulse Rate 04/02/21 2037 (!) 122     Resp 04/02/21 2037 16     Temp 04/02/21 2038 98.1 F (36.7 C)     Temp Source 04/02/21 2038 Oral     SpO2 04/02/21 2037 100 %     Weight 04/02/21 2035 119 lb 11.2 oz (54.3 kg)     Height --      Head Circumference --       Peak Flow --      Pain Score --      Pain Loc --      Pain Edu? --      Excl. in [place]? --     Most recent vital signs: Vitals:   04/02/21 2037 04/02/21 2038  BP: (!) 152/66   Pulse: (!) 122   Resp: 16   Temp:  98.1 F (36.7 C)  SpO2: 100%     CONSTITUTIONAL: Alert, elderly, unable to answer any questions, agitated HEAD: Normocephalic, atraumatic EYES: Conjunctivae clear, pupils appear equal, sclera nonicteric ENT: normal nose; dry mucous membranes NECK: Supple, normal ROM, no midline spinal tenderness or step-off or deformity CARD: Regular and tachycardic; S1 and S2 appreciated; no murmurs, no clicks, no rubs, no gallops RESP: Normal chest excursion without splinting or tachypnea; breath sounds clear and equal bilaterally; no wheezes, no rhonchi, no rales, no hypoxia or respiratory distress, speaking full sentences ABD/GI: Normal bowel sounds; non-distended; soft, non-tender, no rebound, no guarding, no peritoneal signs BACK: The back appears normal EXT: Normal ROM in all joints; no deformity noted, no edema; no cyanosis SKIN: Normal color for age and race; warm; no rash on exposed skin  NEURO: Moves all extremities equally, slightly garbled speech PSYCH: slightly agitated.   ED Results / Procedures / Treatments   LABS: (all labs ordered are listed, but only abnormal results are displayed) Labs Reviewed  CBC WITH DIFFERENTIAL/PLATELET - Abnormal; Notable for the following components:      Result Value   WBC 12.3 (*)    MCV 105.4 (*)    Neutro Abs 10.5 (*)    All other components within normal limits  COMPREHENSIVE METABOLIC PANEL - Abnormal; Notable for the following components:   Sodium 168 (*)    Potassium 3.3 (*)    Chloride >130 (*)    Glucose, Bld 157 (*)    BUN 55 (*)    Creatinine, Ser 1.40 (*)    Calcium 8.3 (*)    Albumin 2.7 (*)    AST 46 (*)    GFR, Estimated 53 (*)    All other components within normal limits  CBG MONITORING, ED - Abnormal; Notable for  the following components:   Glucose-Capillary 45 (*)    All other components within normal limits  CBG MONITORING, ED - Abnormal; Notable for the following components:   Glucose-Capillary 151 (*)    All other components within normal limits  RESP PANEL BY RT-PCR (FLU A&B, COVID) [id number]  URINALYSIS, ROUTINE W REFLEX MICROSCOPIC     EKG:  EKG Interpretation  Date/Time:  Wednesday April 02 2021 20:47:59 EST Ventricular Rate:  121 PR Interval:  151 QRS Duration: 83 QT Interval:  341 QTC Calculation: 484 R Axis:   79 Text Interpretation: Sinus tachycardia Probable anteroseptal infarct, old Borderline repol abnormality, diffuse leads Artifact in lead(s) V1 Confirmed by Pryor Curia [phone]) on 04/02/2021 9:18:12 PM         RADIOLOGY: My personal review and interpretation of imaging: CT head and cervical spine show no acute traumatic injury.  I have personally reviewed all radiology reports.   CT HEAD WO CONTRAST (5MM)  Result Date: 04/02/2021 CLINICAL DATA:  Altered mental status. EXAM: CT HEAD WITHOUT CONTRAST TECHNIQUE: Contiguous axial images were obtained from the base of the skull through the vertex without intravenous contrast. RADIATION DOSE REDUCTION: This exam was performed according to the departmental dose-optimization program which includes automated exposure control, adjustment of the mA and/or kV according to patient size and/or use of iterative reconstruction technique. COMPARISON:  February 07, 2021 FINDINGS: Brain: There is mild cerebral atrophy with widening of the extra-axial spaces and ventricular dilatation. There are areas of decreased attenuation within the white matter tracts of the supratentorial brain, consistent with microvascular disease changes. Chronic bilateral basal ganglia lacunar infarcts are seen. Vascular: No hyperdense vessel or unexpected calcification. Skull: Normal. Negative for fracture or focal lesion. Sinuses/Orbits: No acute finding. Other: The  study is limited secondary to patient motion. IMPRESSION: 1. Generalized cerebral atrophy. 2. No acute intracranial abnormality. Electronically Signed   By: Virgina Norfolk M.D.   On: 04/02/2021 21:42   CT Cervical Spine Wo Contrast  Result Date: 04/02/2021 CLINICAL DATA:  Declining mental function. EXAM: CT CERVICAL SPINE WITHOUT CONTRAST TECHNIQUE: Multidetector CT imaging of the cervical spine was performed without intravenous contrast. Multiplanar CT image reconstructions were also generated. RADIATION DOSE REDUCTION: This exam was performed according to the departmental dose-optimization program which includes automated exposure control, adjustment of the mA and/or kV according to patient size and/or use of iterative reconstruction technique. COMPARISON:  None. FINDINGS: Alignment: Normal. Skull base and vertebrae: No acute fracture. A metallic density fusion plate and  screws are seen along the anterior aspects of the C4, C5, C6 and C7. Soft tissues and spinal canal: No prevertebral fluid or swelling. No visible canal hematoma. Disc levels: Mild endplate sclerosis is seen at the levels of C2-C3 and C3-C4. There is moderate severity narrowing of the anterior atlantoaxial articulation. Anterior surgical fusion is seen at the levels of C4-C5, C5-C6 and C6-C7. Bilateral marked severity facet joint hypertrophy is seen at the levels of C1-C2 and C2-C3. Upper chest: Negative. Other: The study is markedly limited secondary to patient motion. IMPRESSION: 1. Markedly limited secondary to patient motion. 2. No acute cervical spine fracture or subluxation. 3. Degenerative changes with anterior surgical fusion at the levels of C4-C5, C5-C6 and C6-C7. Electronically Signed   By: Aram Candela M.D.   On: 04/02/2021 21:47     PROCEDURES:  Critical Care performed: Yes, see critical care procedure note(s)   CRITICAL CARE Performed by: Baxter Hire Navy Rothschild   Total critical care time: 55 minutes  Critical care time  was exclusive of separately billable procedures and treating other patients.  Critical care was necessary to treat or prevent imminent or life-threatening deterioration.  Critical care was time spent personally by me on the following activities: development of treatment plan with patient and/or surrogate as well as nursing, discussions with consultants, evaluation of patient's response to treatment, examination of patient, obtaining history from patient or surrogate, ordering and performing treatments and interventions, ordering and review of laboratory studies, ordering and review of radiographic studies, pulse oximetry and re-evaluation of patient's condition.   Marland Kitchen1-3 Lead EKG Interpretation Performed by: Shannelle Alguire, Layla Maw, DO Authorized by: Jasman Murri, Layla Maw, DO     Interpretation: abnormal     ECG rate:  125   ECG rate assessment: tachycardic     Rhythm: sinus tachycardia     Ectopy: none     Conduction: normal      IMPRESSION / MDM / ASSESSMENT AND PLAN / ED COURSE  I reviewed the triage vital signs and the nursing notes.    Patient here with increasing altered mental status, decreased p.o. intake over the past couple of days with recent falls.  The patient is on the cardiac monitor to evaluate for evidence of arrhythmia and/or significant heart rate changes.   DIFFERENTIAL DIAGNOSIS (includes but not limited to):   Worsening dementia, deconditioning, anemia, electrolyte derangement, hypoglycemia, UTI, dehydration, intracranial hemorrhage, CVA   PLAN: We will obtain CT of the head and cervical spine given recent falls and patient on Plavix.  Blood sugar was 77 with EMS and they gave some D10.  We will recheck his blood sugar here.  Will check CBC, BMP, urinalysis, EKG.  Family states they are concerned because he will not eat and drink.   MEDICATIONS GIVEN IN ED: Medications  dextrose 10 % infusion ( Intravenous New Bag/Given 04/02/21 2113)  dextrose 50 % solution (  Given 04/02/21  2050)     ED COURSE: CT head and cervical spine reviewed by myself and radiologist and show no traumatic injury, intracranial hemorrhage or infarct.  He has a mild leukocytosis.  We will check a rectal temperature.  We will need to obtain a catheterized urine specimen.  His blood work shows a significantly elevated sodium and chloride level consistent with dehydration from decreased oral intake and failure to thrive.  His initial blood glucose was 45 and he was given D50 and a D10 infusion was started and his blood sugar is now 151.  We will  continue the D10 infusion and discussed with medicine for admission.   CONSULTS:  Consulted and discussed patient's case with hospitalist, Dr. Myna Hidalgo.  I have recommended admission and consulting physician agrees and will place admission orders.  Patient (and family if present) agree with this plan.   I reviewed all nursing notes, vitals, pertinent previous records.  All labs, EKGs, imaging ordered have been independently reviewed and interpreted by myself.    OUTSIDE RECORDS REVIEWED: Reviewed patient's recent admission in January 2023.         FINAL CLINICAL IMPRESSION(S) / ED DIAGNOSES   Final diagnoses:  Altered mental status, unspecified altered mental status type  Hypoglycemia  Sinus tachycardia  Dehydration  Hypernatremia  FTT (failure to thrive) in adult     Rx / DC Orders   ED Discharge Orders     None        Note:  This document was prepared using Dragon voice recognition software and may include unintentional dictation errors.   Telesforo Brosnahan, Delice Bison, DO 04/02/21 2306

## 2021-04-02 NOTE — ED Notes (Signed)
Fall alarm placed on pt 

## 2021-04-03 ENCOUNTER — Encounter: Payer: Self-pay | Admitting: Family Medicine

## 2021-04-03 DIAGNOSIS — E162 Hypoglycemia, unspecified: Secondary | ICD-10-CM | POA: Insufficient documentation

## 2021-04-03 DIAGNOSIS — R627 Adult failure to thrive: Secondary | ICD-10-CM | POA: Diagnosis not present

## 2021-04-03 DIAGNOSIS — F0390 Unspecified dementia without behavioral disturbance: Secondary | ICD-10-CM | POA: Diagnosis present

## 2021-04-03 DIAGNOSIS — E876 Hypokalemia: Secondary | ICD-10-CM | POA: Insufficient documentation

## 2021-04-03 DIAGNOSIS — N179 Acute kidney failure, unspecified: Secondary | ICD-10-CM | POA: Diagnosis present

## 2021-04-03 DIAGNOSIS — E870 Hyperosmolality and hypernatremia: Secondary | ICD-10-CM | POA: Diagnosis present

## 2021-04-03 LAB — PHOSPHORUS: Phosphorus: 2.6 mg/dL (ref 2.5–4.6)

## 2021-04-03 LAB — BASIC METABOLIC PANEL
Anion gap: 8 (ref 5–15)
BUN: 52 mg/dL — ABNORMAL HIGH (ref 8–23)
BUN: 40 mg/dL — ABNORMAL HIGH (ref 8–23)
CO2: 26 mmol/L (ref 22–32)
CO2: 25 mmol/L (ref 22–32)
Calcium: 8.4 mg/dL — ABNORMAL LOW (ref 8.9–10.3)
Calcium: 8 mg/dL — ABNORMAL LOW (ref 8.9–10.3)
Chloride: >130 mmol/L (ref 98–111)
Chloride: 129 mmol/L — ABNORMAL HIGH (ref 98–111)
Creatinine, Ser: 1.4 mg/dL — ABNORMAL HIGH (ref 0.61–1.24)
Creatinine, Ser: 1.16 mg/dL (ref 0.61–1.24)
GFR, Estimated: 53 mL/min — ABNORMAL LOW (ref >60)
GFR, Estimated: >60 mL/min (ref >60)
Glucose, Bld: 152 mg/dL — ABNORMAL HIGH (ref 70–99)
Glucose, Bld: 133 mg/dL — ABNORMAL HIGH (ref 70–99)
Potassium: 3.4 mmol/L — ABNORMAL LOW (ref 3.5–5.1)
Potassium: 3.3 mmol/L — ABNORMAL LOW (ref 3.5–5.1)
Sodium: 167 mmol/L (ref 135–145)
Sodium: 162 mmol/L (ref 135–145)

## 2021-04-03 LAB — HEPATIC FUNCTION PANEL
ALT: 39 U/L (ref 0–44)
AST: 45 U/L — ABNORMAL HIGH (ref 15–41)
Albumin: 2.7 g/dL — ABNORMAL LOW (ref 3.5–5.0)
Alkaline Phosphatase: 95 U/L (ref 38–126)
Bilirubin, Direct: 0.2 mg/dL (ref 0.0–0.2)
Indirect Bilirubin: 1.1 mg/dL — ABNORMAL HIGH (ref 0.3–0.9)
Total Bilirubin: 1.3 mg/dL — ABNORMAL HIGH (ref 0.3–1.2)
Total Protein: 6.3 g/dL — ABNORMAL LOW (ref 6.5–8.1)

## 2021-04-03 LAB — GLUCOSE, CAPILLARY
Glucose-Capillary: 153 mg/dL — ABNORMAL HIGH (ref 70–99)
Glucose-Capillary: 155 mg/dL — ABNORMAL HIGH (ref 70–99)
Glucose-Capillary: 124 mg/dL — ABNORMAL HIGH (ref 70–99)
Glucose-Capillary: 142 mg/dL — ABNORMAL HIGH (ref 70–99)
Glucose-Capillary: 120 mg/dL — ABNORMAL HIGH (ref 70–99)
Glucose-Capillary: 104 mg/dL — ABNORMAL HIGH (ref 70–99)

## 2021-04-03 LAB — CBC
HCT: 43.2 % (ref 39.0–52.0)
Hemoglobin: 13.4 g/dL (ref 13.0–17.0)
MCH: 32.4 pg (ref 26.0–34.0)
MCHC: 31 g/dL (ref 30.0–36.0)
MCV: 104.6 fL — ABNORMAL HIGH (ref 80.0–100.0)
Platelets: 122 10*3/uL — ABNORMAL LOW (ref 150–400)
RBC: 4.13 MIL/uL — ABNORMAL LOW (ref 4.22–5.81)
RDW: 13.6 % (ref 11.5–15.5)
WBC: 9.9 10*3/uL (ref 4.0–10.5)
nRBC: 0.2 % (ref 0.0–0.2)

## 2021-04-03 LAB — MAGNESIUM: Magnesium: 2.6 mg/dL — ABNORMAL HIGH (ref 1.7–2.4)

## 2021-04-03 MED ORDER — POTASSIUM CHLORIDE 10 MEQ/100ML IV SOLN
10.0000 meq | INTRAVENOUS | Status: AC
  Administered 2021-04-03 (×2): 10 meq via INTRAVENOUS
  Filled 2021-04-03: qty 100

## 2021-04-03 MED ORDER — DEXTROSE 10 % IV SOLN: INTRAVENOUS | Status: AC

## 2021-04-03 MED ORDER — ONDANSETRON HCL 4 MG PO TABS: 4.0000 mg | ORAL_TABLET | Freq: Four times a day (QID) | ORAL | Status: DC | PRN

## 2021-04-03 MED ORDER — HEPARIN SODIUM (PORCINE) 5000 UNIT/ML IJ SOLN
5000.0000 [IU] | Freq: Three times a day (TID) | INTRAMUSCULAR | Status: DC
  Administered 2021-04-03 – 2021-04-05 (×7): 5000 [IU] via SUBCUTANEOUS
  Filled 2021-04-03 (×7): qty 1

## 2021-04-03 MED ORDER — ONDANSETRON HCL 4 MG/2ML IJ SOLN
4.0000 mg | Freq: Four times a day (QID) | INTRAMUSCULAR | Status: DC | PRN
Start: 2021-04-03 — End: 2021-04-06

## 2021-04-03 MED ORDER — ACETAMINOPHEN 325 MG PO TABS: 650.0000 mg | ORAL_TABLET | Freq: Four times a day (QID) | ORAL | Status: DC | PRN

## 2021-04-03 MED ORDER — ACETAMINOPHEN 650 MG RE SUPP
650.0000 mg | Freq: Four times a day (QID) | RECTAL | Status: DC | PRN
Start: 2021-04-03 — End: 2021-04-06

## 2021-04-03 MED ORDER — SODIUM CHLORIDE 0.9% FLUSH
3.0000 mL | Freq: Two times a day (BID) | INTRAVENOUS | Status: DC
  Administered 2021-04-03 – 2021-04-05 (×5): 3 mL via INTRAVENOUS

## 2021-04-03 MED ORDER — DEXTROSE 10 % IV SOLN: INTRAVENOUS | Status: DC

## 2021-04-03 MED ORDER — INSULIN ASPART 100 UNIT/ML IJ SOLN: 0.0000 [IU] | INTRAMUSCULAR | Status: DC

## 2021-04-03 NOTE — Progress Notes (Signed)
PROGRESS NOTE    Paul Fitzgerald.  PPJ:[id number] DOB: 1950/10/07 DOA: 04/02/2021 PCP: Pcp, No    Brief Narrative:  71 y.o. male with medical history significant for dementia, hypertension, and CVA, now presenting to the emergency department after going 2 days without eating or drinking anything.  Patient lives with his son who notes that the patient had not been complaining of anything and seemed to be in his usual state but has been refusing to eat or drink for the past 2 days.  At his baseline, he requires assistance to eat or drink, needs assistance to ambulate, and is unable to have a conversation.  Patient with severe hypernatremia and hyperchloremia.  Started on D10 infusion at 100 cc/h on admission.  Admission sodium 168, subsequently downtrend to 167.  Per bedside RN patient also significantly constipated.  Status post digital disimpaction.   Assessment & Plan:   Principal Problem:   Failure to thrive in adult Active Problems:   History of CVA (cerebrovascular accident)   Hypertension   Hypernatremia   AKI (acute kidney injury) (HCC)   Dementia (HCC)    AKI; hypernatremia; anorexia  - Presents after not eating or drinking for 2 days and found to have AKI and sodium 168  - Unclear why he stopped eating, possibly end-stage dementia; family worried he might have had difficulty swallowing; thyroid studies normal recently  -Suspect progression of underlying dementia/cognitive decline -AKI likely prerenal -Hypernatremia indicative of severe dehydration Plan: Continue D10 infusion at 100 cc/h Renally dose all medications N.p.o. until speech evaluation Palliative care consultation requested    Dementia Cognitive decline Adult failure to thrive - Per discussion with family, at baseline, pt is disoriented and needs assistance to eat and ambulate   - Delirium precautions     History of CVA  - No acute findings on head CT in ED  - Resume antiplatelets and statin as tolerated  after SLP consult    Hypoglycemia  - CBG 45 in ED, treated with IV dextrose -On D10 infusion, continue to monitor   DVT prophylaxis: SQ heparin Code Status: DNR Family Communication: Son and daughter-in-law via phone 3/9 Disposition Plan: Status is: Inpatient Remains inpatient appropriate because: Severe hypernatremia, adult failure to thrive, poor p.o. intake   Level of care: Med-Surg  Consultants:  Palliative care  Procedures:  None  Antimicrobials: None   Subjective: Patient seen and examined.  Visibly appears frail.  Verbally unresponsive.  Objective: Vitals:   04/02/21 2331 04/03/21 0128 04/03/21 0409 04/03/21 0810  BP: 130/67 118/83 136/70 134/72  Pulse: (!) 108 99 98 99  Resp: 18 20 16 16   Temp: 98.8 F (37.1 C) 98.9 F (37.2 C) 98.1 F (36.7 C) 98.3 F (36.8 C)  TempSrc: Rectal Axillary Axillary Axillary  SpO2: 100% 100% 99% 96%  Weight:        Intake/Output Summary (Last 24 hours) at 04/03/2021 1041 Last data filed at 04/03/2021 0305 Gross per 24 hour  Intake 300.04 ml  Output --  Net 300.04 ml   Filed Weights   04/02/21 2035  Weight: 54.3 kg    Examination:  General exam: Appears frail.  Nonverbal Respiratory system: Poor respiratory effort.  Lungs clear.  Normal work of breathing.  Room air Cardiovascular system: Tachycardic, regular rhythm, no murmurs Gastrointestinal system: Soft, NT/ND, normal bowel sounds Central nervous system: Alert, unable to assess orientation, verbally unresponsive Extremities: Decreased power symmetrically Skin: No rashes, lesions or ulcers Psychiatry: Unable to assess    Data  Reviewed: I have personally reviewed following labs and imaging studies  CBC: Recent Labs  Lab 04/02/21 2040 04/03/21 0435  WBC 12.3* 9.9  NEUTROABS 10.5*  --   HGB 14.9 13.4  HCT 48.6 43.2  MCV 105.4* 104.6*  PLT 151 122*   Basic Metabolic Panel: Recent Labs  Lab 04/02/21 2040 04/03/21 0435  NA 168* 167*  K 3.3* 3.4*   CL >130* >130*  CO2 25 26  GLUCOSE 157* 152*  BUN 55* 52*  CREATININE 1.40* 1.40*  CALCIUM 8.3* 8.4*  MG  --  2.6*  PHOS  --  2.6   GFR: Estimated Creatinine Clearance: 36.6 mL/min (A) (by C-G formula based on SCr of 1.4 mg/dL (H)). Liver Function Tests: Recent Labs  Lab 04/02/21 2040 04/03/21 0435  AST 46* 45*  ALT 40 39  ALKPHOS 93 95  BILITOT 1.1 1.3*  PROT 7.0 6.3*  ALBUMIN 2.7* 2.7*   No results for input(s): LIPASE, AMYLASE in the last 168 hours. No results for input(s): AMMONIA in the last 168 hours. Coagulation Profile: No results for input(s): INR, PROTIME in the last 168 hours. Cardiac Enzymes: No results for input(s): CKTOTAL, CKMB, CKMBINDEX, TROPONINI in the last 168 hours. BNP (last 3 results) No results for input(s): PROBNP in the last 8760 hours. HbA1C: No results for input(s): HGBA1C in the last 72 hours. CBG: Recent Labs  Lab 04/02/21 2044 04/02/21 2231 04/03/21 0410 04/03/21 0437 04/03/21 0809  GLUCAP 45* 151* 153* 155* 124*   Lipid Profile: No results for input(s): CHOL, HDL, LDLCALC, TRIG, CHOLHDL, LDLDIRECT in the last 72 hours. Thyroid Function Tests: No results for input(s): TSH, T4TOTAL, FREET4, T3FREE, THYROIDAB in the last 72 hours. Anemia Panel: No results for input(s): VITAMINB12, FOLATE, FERRITIN, TIBC, IRON, RETICCTPCT in the last 72 hours. Sepsis Labs: No results for input(s): PROCALCITON, LATICACIDVEN in the last 168 hours.  Recent Results (from the past 240 hour(s))  Resp Panel by RT-PCR (Flu A&B, Covid) Nasopharyngeal Swab     Status: None   Collection Time: 04/02/21 10:47 PM   Specimen: Nasopharyngeal Swab; Nasopharyngeal(NP) swabs in vial transport medium  Result Value Ref Range Status   SARS Coronavirus 2 by RT PCR NEGATIVE NEGATIVE Final    Comment: (NOTE) SARS-CoV-2 target nucleic acids are NOT DETECTED.  The SARS-CoV-2 RNA is generally detectable in upper respiratory specimens during the acute phase of infection.  The lowest concentration of SARS-CoV-2 viral copies this assay can detect is 138 copies/mL. A negative result does not preclude SARS-Cov-2 infection and should not be used as the sole basis for treatment or other patient management decisions. A negative result may occur with  improper specimen collection/handling, submission of specimen other than nasopharyngeal swab, presence of viral mutation(s) within the areas targeted by this assay, and inadequate number of viral copies(<138 copies/mL). A negative result must be combined with clinical observations, patient history, and epidemiological information. The expected result is Negative.  Fact Sheet for Patients:  BloggerCourse.comhttps://www.fda.gov/media/152166/download  Fact Sheet for Healthcare Providers:  SeriousBroker.ithttps://www.fda.gov/media/152162/download  This test is no t yet approved or cleared by the [place] FDA and  has been authorized for detection and/or diagnosis of SARS-CoV-2 by FDA under an Emergency Use Authorization (EUA). This EUA will remain  in effect (meaning this test can be used) for the duration of the COVID-19 declaration under Section 564(b)(1) of the Act, 21 U.S.C.section 360bbb-3(b)(1), unless the authorization is terminated  or revoked sooner.       Influenza A by PCR  NEGATIVE NEGATIVE Final   Influenza B by PCR NEGATIVE NEGATIVE Final    Comment: (NOTE) The Xpert Xpress SARS-CoV-2/FLU/RSV plus assay is intended as an aid in the diagnosis of influenza from Nasopharyngeal swab specimens and should not be used as a sole basis for treatment. Nasal washings and aspirates are unacceptable for Xpert Xpress SARS-CoV-2/FLU/RSV testing.  Fact Sheet for Patients: BloggerCourse.com  Fact Sheet for Healthcare Providers: SeriousBroker.it  This test is not yet approved or cleared by the [place] FDA and has been authorized for detection and/or diagnosis of SARS-CoV-2 by FDA  under an Emergency Use Authorization (EUA). This EUA will remain in effect (meaning this test can be used) for the duration of the COVID-19 declaration under Section 564(b)(1) of the Act, 21 U.S.C. section 360bbb-3(b)(1), unless the authorization is terminated or revoked.  Performed at [facility], [address] [place], [place] [address]          Radiology Studies: CT HEAD WO CONTRAST ( )  Result Date: 04/02/2021 CLINICAL DATA:  Altered mental status. EXAM: CT HEAD WITHOUT CONTRAST TECHNIQUE: Contiguous axial images were obtained from the base of the skull through the vertex without intravenous contrast. RADIATION DOSE REDUCTION: This exam was performed according to the departmental dose-optimization program which includes automated exposure control, adjustment of the mA and/or kV according to patient size and/or use of iterative reconstruction technique. COMPARISON:  February 07, 2021 FINDINGS: Brain: There is mild cerebral atrophy with widening of the extra-axial spaces and ventricular dilatation. There are areas of decreased attenuation within the white matter tracts of the supratentorial brain, consistent with microvascular disease changes. Chronic bilateral basal ganglia lacunar infarcts are seen. Vascular: No hyperdense vessel or unexpected calcification. Skull: Normal. Negative for fracture or focal lesion. Sinuses/Orbits: No acute finding. Other: The study is limited secondary to patient motion. IMPRESSION: 1. Generalized cerebral atrophy. 2. No acute intracranial abnormality. Electronically Signed   By: Aram Candela M.D.   On: 04/02/2021 21:42   CT Cervical Spine Wo Contrast  Result Date: 04/02/2021 CLINICAL DATA:  Declining mental function. EXAM: CT CERVICAL SPINE WITHOUT CONTRAST TECHNIQUE: Multidetector CT imaging of the cervical spine was performed without intravenous contrast. Multiplanar CT image reconstructions were also generated. RADIATION DOSE REDUCTION:  This exam was performed according to the departmental dose-optimization program which includes automated exposure control, adjustment of the mA and/or kV according to patient size and/or use of iterative reconstruction technique. COMPARISON:  None. FINDINGS: Alignment: Normal. Skull base and vertebrae: No acute fracture. A metallic density fusion plate and screws are seen along the anterior aspects of the C4, C5, C6 and C7. Soft tissues and spinal canal: No prevertebral fluid or swelling. No visible canal hematoma. Disc levels: Mild endplate sclerosis is seen at the levels of C2-C3 and C3-C4. There is moderate severity narrowing of the anterior atlantoaxial articulation. Anterior surgical fusion is seen at the levels of C4-C5, C5-C6 and C6-C7. Bilateral marked severity facet joint hypertrophy is seen at the levels of C1-C2 and C2-C3. Upper chest: Negative. Other: The study is markedly limited secondary to patient motion. IMPRESSION: 1. Markedly limited secondary to patient motion. 2. No acute cervical spine fracture or subluxation. 3. Degenerative changes with anterior surgical fusion at the levels of C4-C5, C5-C6 and C6-C7. Electronically Signed   By: Aram Candela M.D.   On: 04/02/2021 21:47        Scheduled Meds:  heparin  5,000 Units Subcutaneous Q8H   insulin aspart  0-4 Units Subcutaneous Q4H  sodium chloride flush  3 mL Intravenous Q12H   Continuous Infusions:  dextrose 100 mL/hr at 04/03/21 0909     LOS: 1 day      Tresa Moore, MD [facility]   If 7PM-7AM, please contact night-coverage  04/03/2021, 10:41 AM

## 2021-04-03 NOTE — H&P (Signed)
History and Physical    Paul Fitzgerald. XAJ:[id number] DOB: 12-Jan-1951 DOA: 04/02/2021  PCP: Pcp, No   Patient coming from: Home   Chief Complaint: Not eating or drinking   HPI: Paul Fitzgerald. is a pleasant 71 y.o. male with medical history significant for dementia, hypertension, and CVA, now presenting to the emergency department after going 2 days without eating or drinking anything.  Patient lives with his son who notes that the patient had not been complaining of anything and seemed to be in his usual state but has been refusing to eat or drink for the past 2 days.  At his baseline, he requires assistance to eat or drink, needs assistance to ambulate, and is unable to have a conversation.  ED Course: Upon arrival to the ED, patient is found to be afebrile and saturating well on room air with heart rate in the 120s and stable blood pressure.  EKG features sinus tachycardia.  No acute finding on CT head or CT cervical spine.  Chemistry panel notable for sodium 168, potassium 3.3, BUN 55, and creatinine 1.40.  CBC with mild leukocytosis and macrocytosis.  Patient was started on 10% dextrose infusion.  Review of Systems:  Pleat ROS secondary to the patient's clinical condition.  Past Medical History:  Diagnosis Date   Hypertension    TIA (transient ischemic attack)     History reviewed. No pertinent surgical history.  Social History:   reports that he has been smoking cigarettes. He has a 12.50 pack-year smoking history. He does not have any smokeless tobacco history on file. He reports that he does not currently use alcohol. He reports that he does not use drugs.  No Known Allergies  History reviewed. No pertinent family history.   Prior to Admission medications   Medication Sig Start Date End Date Taking? Authorizing Provider  aspirin EC 81 MG tablet Take 81 mg by mouth at bedtime. Swallow whole.    [provider]  atorvastatin (LIPITOR) 40 MG tablet Take 1 tablet (40  mg total) by mouth daily. 02/10/21   Haydee Monica, MD  clopidogrel (PLAVIX) 75 MG tablet Take 1 tablet (75 mg total) by mouth daily. 02/10/21   Haydee Monica, MD    Physical Exam: Vitals:   04/02/21 2035 04/02/21 2037 04/02/21 2038 04/02/21 2331  BP:  (!) 152/66  130/67  Pulse:  (!) 122  (!) 108  Resp:  16  18  Temp:   98.1 F (36.7 C) 98.8 F (37.1 C)  TempSrc:   Oral Rectal  SpO2:  100%  100%  Weight: 54.3 kg       Constitutional: NAD, calm, frail appearing   Eyes: PERTLA, lids and conjunctivae normal ENMT: Mucous membranes are moist. Posterior pharynx clear of any exudate or lesions.   Neck: supple, no masses  Respiratory: no wheezing, no crackles. No accessory muscle use.  Cardiovascular: S1 & S2 heard, regular rate and rhythm. No extremity edema.   Abdomen: No distension, no tenderness, soft. Bowel sounds active.  Musculoskeletal: no clubbing / cyanosis. No joint deformity upper and lower extremities.   Skin: no significant rashes, lesions, ulcers. Warm, dry, well-perfused. Neurologic: No gross facial asymmetry. Makes eye contact but no verbal response. Moving all extremities.   Psychiatric: Calm. Cooperative.    Labs and Imaging on Admission: I have personally reviewed following labs and imaging studies  CBC: Recent Labs  Lab 04/02/21 2040  WBC 12.3*  NEUTROABS 10.5*  HGB 14.9  HCT  48.6  MCV 105.4*  PLT 151   Basic Metabolic Panel: Recent Labs  Lab 04/02/21 2040  NA 168*  K 3.3*  CL >130*  CO2 25  GLUCOSE 157*  BUN 55*  CREATININE 1.40*  CALCIUM 8.3*   GFR: Estimated Creatinine Clearance: 36.6 mL/min (A) (by C-G formula based on SCr of 1.4 mg/dL (H)). Liver Function Tests: Recent Labs  Lab 04/02/21 2040  AST 46*  ALT 40  ALKPHOS 93  BILITOT 1.1  PROT 7.0  ALBUMIN 2.7*   No results for input(s): LIPASE, AMYLASE in the last 168 hours. No results for input(s): AMMONIA in the last 168 hours. Coagulation Profile: No results for input(s):  INR, PROTIME in the last 168 hours. Cardiac Enzymes: No results for input(s): CKTOTAL, CKMB, CKMBINDEX, TROPONINI in the last 168 hours. BNP (last 3 results) No results for input(s): PROBNP in the last 8760 hours. HbA1C: No results for input(s): HGBA1C in the last 72 hours. CBG: Recent Labs  Lab 04/02/21 2044 04/02/21 2231  GLUCAP 45* 151*   Lipid Profile: No results for input(s): CHOL, HDL, LDLCALC, TRIG, CHOLHDL, LDLDIRECT in the last 72 hours. Thyroid Function Tests: No results for input(s): TSH, T4TOTAL, FREET4, T3FREE, THYROIDAB in the last 72 hours. Anemia Panel: No results for input(s): VITAMINB12, FOLATE, FERRITIN, TIBC, IRON, RETICCTPCT in the last 72 hours. Urine analysis:    Component Value Date/Time   COLORURINE AMBER (A) 04/02/2021 2040   APPEARANCEUR HAZY (A) 04/02/2021 2040   LABSPEC 1.027 04/02/2021 2040   PHURINE 5.0 04/02/2021 2040   GLUCOSEU 50 (A) 04/02/2021 2040   HGBUR NEGATIVE 04/02/2021 2040   BILIRUBINUR NEGATIVE 04/02/2021 2040   KETONESUR NEGATIVE 04/02/2021 2040   PROTEINUR 30 (A) 04/02/2021 2040   NITRITE NEGATIVE 04/02/2021 2040   LEUKOCYTESUR NEGATIVE 04/02/2021 2040   Sepsis Labs: @LABRCNTIP (procalcitonin:4,lacticidven:4) ) Recent Results (from the past 240 hour(s))  Resp Panel by RT-PCR (Flu A&B, Covid) Nasopharyngeal Swab     Status: None   Collection Time: 04/02/21 10:47 PM   Specimen: Nasopharyngeal Swab; Nasopharyngeal(NP) swabs in vial transport medium  Result Value Ref Range Status   SARS Coronavirus 2 by RT PCR NEGATIVE NEGATIVE Final    Comment: (NOTE) SARS-CoV-2 target nucleic acids are NOT DETECTED.  The SARS-CoV-2 RNA is generally detectable in upper respiratory specimens during the acute phase of infection. The lowest concentration of SARS-CoV-2 viral copies this assay can detect is 138 copies/mL. A negative result does not preclude SARS-Cov-2 infection and should not be used as the sole basis for treatment or other  patient management decisions. A negative result may occur with  improper specimen collection/handling, submission of specimen other than nasopharyngeal swab, presence of viral mutation(s) within the areas targeted by this assay, and inadequate number of viral copies(<138 copies/mL). A negative result must be combined with clinical observations, patient history, and epidemiological information. The expected result is Negative.  Fact Sheet for Patients:  BloggerCourse.comhttps://www.fda.gov/media/152166/download  Fact Sheet for Healthcare Providers:  SeriousBroker.ithttps://www.fda.gov/media/152162/download  This test is no t yet approved or cleared by the [place] FDA and  has been authorized for detection and/or diagnosis of SARS-CoV-2 by FDA under an Emergency Use Authorization (EUA). This EUA will remain  in effect (meaning this test can be used) for the duration of the COVID-19 declaration under Section 564(b)(1) of the Act, 21 U.S.C.section 360bbb-3(b)(1), unless the authorization is terminated  or revoked sooner.       Influenza A by PCR NEGATIVE NEGATIVE Final   Influenza B by  PCR NEGATIVE NEGATIVE Final    Comment: (NOTE) The Xpert Xpress SARS-CoV-2/FLU/RSV plus assay is intended as an aid in the diagnosis of influenza from Nasopharyngeal swab specimens and should not be used as a sole basis for treatment. Nasal washings and aspirates are unacceptable for Xpert Xpress SARS-CoV-2/FLU/RSV testing.  Fact Sheet for Patients: BloggerCourse.com  Fact Sheet for Healthcare Providers: SeriousBroker.it  This test is not yet approved or cleared by the [place] FDA and has been authorized for detection and/or diagnosis of SARS-CoV-2 by FDA under an Emergency Use Authorization (EUA). This EUA will remain in effect (meaning this test can be used) for the duration of the COVID-19 declaration under Section 564(b)(1) of the Act, 21 U.S.C. section  360bbb-3(b)(1), unless the authorization is terminated or revoked.  Performed at [facility], [address], [place], [place] [address]      Radiological Exams on Admission: CT HEAD WO CONTRAST ( )  Result Date: 04/02/2021 CLINICAL DATA:  Altered mental status. EXAM: CT HEAD WITHOUT CONTRAST TECHNIQUE: Contiguous axial images were obtained from the base of the skull through the vertex without intravenous contrast. RADIATION DOSE REDUCTION: This exam was performed according to the departmental dose-optimization program which includes automated exposure control, adjustment of the mA and/or kV according to patient size and/or use of iterative reconstruction technique. COMPARISON:  February 07, 2021 FINDINGS: Brain: There is mild cerebral atrophy with widening of the extra-axial spaces and ventricular dilatation. There are areas of decreased attenuation within the white matter tracts of the supratentorial brain, consistent with microvascular disease changes. Chronic bilateral basal ganglia lacunar infarcts are seen. Vascular: No hyperdense vessel or unexpected calcification. Skull: Normal. Negative for fracture or focal lesion. Sinuses/Orbits: No acute finding. Other: The study is limited secondary to patient motion. IMPRESSION: 1. Generalized cerebral atrophy. 2. No acute intracranial abnormality. Electronically Signed   By: Aram Candela M.D.   On: 04/02/2021 21:42   CT Cervical Spine Wo Contrast  Result Date: 04/02/2021 CLINICAL DATA:  Declining mental function. EXAM: CT CERVICAL SPINE WITHOUT CONTRAST TECHNIQUE: Multidetector CT imaging of the cervical spine was performed without intravenous contrast. Multiplanar CT image reconstructions were also generated. RADIATION DOSE REDUCTION: This exam was performed according to the departmental dose-optimization program which includes automated exposure control, adjustment of the mA and/or kV according to patient size and/or use of iterative  reconstruction technique. COMPARISON:  None. FINDINGS: Alignment: Normal. Skull base and vertebrae: No acute fracture. A metallic density fusion plate and screws are seen along the anterior aspects of the C4, C5, C6 and C7. Soft tissues and spinal canal: No prevertebral fluid or swelling. No visible canal hematoma. Disc levels: Mild endplate sclerosis is seen at the levels of C2-C3 and C3-C4. There is moderate severity narrowing of the anterior atlantoaxial articulation. Anterior surgical fusion is seen at the levels of C4-C5, C5-C6 and C6-C7. Bilateral marked severity facet joint hypertrophy is seen at the levels of C1-C2 and C2-C3. Upper chest: Negative. Other: The study is markedly limited secondary to patient motion. IMPRESSION: 1. Markedly limited secondary to patient motion. 2. No acute cervical spine fracture or subluxation. 3. Degenerative changes with anterior surgical fusion at the levels of C4-C5, C5-C6 and C6-C7. Electronically Signed   By: Aram Candela M.D.   On: 04/02/2021 21:47    EKG: Independently reviewed. Sinus tachycardia, rate 121.   Assessment/Plan   1. AKI; hypernatremia; anorexia  - Presents after not eating or drinking for 2 days and found to have AKI and sodium  168  - Unclear why he stopped eating, possibly end-stage dementia; family worried he might have had difficulty swallowing; thyroid studies normal recently  - AKI likely prerenal in setting anorexia, plan to continue 10% dextrose infusion, renally-dose medications, repeat chem panel in am, consult SLP    2. Dementia  - Per discussion with family, at baseline, pt is disoriented and needs assistance to eat and ambulate   - Delirium precautions    3. History of CVA  - No acute findings on head CT in ED  - Resume antiplatelets and statin as tolerated after SLP consult   4. Hypoglycemia  - CBG 45 in ED, treated with IV dextrose - Continue dextrose fluids and monitor    DVT prophylaxis: sq heparin  Code Status:  Full  Level of Care: Level of care: Telemetry Medical Family Communication: Discussed with son by phone  Disposition Plan:  Patient is from: home  Anticipated d/c is to: TBD Anticipated d/c date is: 04/06/21  Patient currently: Pending improvement in electrolytes, ability to tolerate adequate PO  Consults called: none  Admission status: inpatient     Briscoe Deutscher, MD [facility]  04/03/2021, 12:19 AM

## 2021-04-03 NOTE — Plan of Care (Signed)
PMT note: ? ?Patient is resting in bed with mittens in place. No family at bedside. Attempted to call daughter unsuccessfully. Called phone number for son Paul Fitzgerald. Ray states he is patient's SIL. He discusses patient's advanced dementia, decline, and poor QOL. He states patient is divorced, and he and his wife are the only family patient has. He states his wife works from [employer]. Will follow up tomorrow morning for further.  ?

## 2021-04-03 NOTE — Evaluation (Addendum)
Clinical/Bedside Swallow Evaluation Patient Details  Name: Danella MaiersConnie Bainter Jr. MRN: [id number] Date of Birth: 07/08/1950  Today's Date: 04/03/2021 Time: SLP Start Time (ACUTE ONLY): 1535 SLP Stop Time (ACUTE ONLY): 1630 SLP Time Calculation (min) (ACUTE ONLY): 55 min  Past Medical History:  Past Medical History:  Diagnosis Date   Hypertension    TIA (transient ischemic attack)    Past Surgical History: History reviewed. No pertinent surgical history. HPI:  Pt is a pleasant 71 y.o. male with medical history significant for Dementia, hypertension, and CVA, now presenting to the emergency department after going 2 days without eating or drinking anything.  Patient lives with his son who notes that the patient had not been complaining of anything and seemed to be in his usual state but has been refusing to eat or drink for the past 2 days.  At his baseline, he requires assistance to eat or drink, needs assistance to ambulate, and is unable to have a conversation.  Family reports since his stroke in January, he has been bedbound.  They report he has had several falls out of bed over the past week. They deny any known fevers, cough, vomiting, diarrhea.  No other changes in medications.  NO CXR.  Lungs clear per MD note.  Admitting dx includes: Adult FTT.    Assessment / Plan / Recommendation  Clinical Impression   Pt seen this PM for BSE. He required much supportive positioning as he tended to remain lying on his Left side w/ legs drawn upward to him. Pt was awake, easily distracted and fidgity in bed. No true verbalizations, just few phonations. He seemed to respond to verbal cues and encouragement. He was able to engage in po trials w/ this Clinician w/ cues/support.   Pt appears to present w/ oral phase dysphagia w/ potential pharyngeal phase dysphagia in setting of declined Cognitive status; pt has Baseline Dementia. Also a recent CVA in Jan 2023. Pt has been dx'd w/ Adult FTT per MD notes. Currently,  pt has an elevated Sodium which MD is addressing. Palliative Care has been consulted for GOC.   Pt is at increased risk for aspiration/aspiration PNA given declined Cognitive status/Dementia, dependence for feeding, prolonged/recent intubation, medical comorbidities, and pt's overall clinical presentation of oral phase dysphagia. ANY Cognitive decline can impact overall awareness/timing of swallow and safety during po tasks which increases risk for aspiration, choking. Pt's risk for aspiration is present but can be reduced when following aspiration precautions, when given feeding support and Supervision w/ feeding strategies/cues, and when using a modified diet consistency.     He required Mod tactile/verbal/visual cues for follow through during po tasks and to complete pharyngeal swallowing. Full sitting support/positioning required also. Pt consumed several trials of purees and thin liquids via tsp then Pinched straw then Straw sips w/ No immediate, overt clinical s/s of aspiration noted; no cough and no decline in respiratory status during/post trials. O2 sats remained 99%. Vocal quality during phonations did not change/decline.   Oral phase was c/b MOD+ deficits including: decreased bolus awareness/prep requiring Mod+ cues, slower bolus management, oral holding, delayed A-P transfer. Mod+ cues given to maintain pt's engagement and follow through w/ A-P transfer and swallowing; tactile and verbal cues to engage swallowing/oral clearing as well. Oral clearing checked b/t bites/sips -- full oral clearing of the boluses given was noted.       D/t pt's declined Cognition/Dementia, his overall medical status, and his risk for aspiration, recommend initiation of the dysphagia level  1(puree) w/ thin liquids w/ support; aspiration precautions and swallowing strategies including verbal/tactile/visual cues to monitor oral holding and ensure swallowing and oral clearing; reduce Distractions during meals and engage  pt during po's at meal w/ self-feeding. Pills Crushed in Puree for safer swallowing. Support w/ feeding at meals. MD/NSG updated. ST services recommends follow w/ Palliative Care for GOC and education re: impact of Cognitive decline and illness on swallowing. Recommend Dietician f/u for support.    ST services can be available in the home for f/u w/ family/pt on education and appropriateness for any diet upgrade in the future as indicated. Recommendations for pureed diet at discharge to best support pt's needs currently. Handouts left.  Precautions posted in room.   SLP Visit Diagnosis: Dysphagia, oral phase (R13.11) (baseline Dementia)    Aspiration Risk  Mild aspiration risk;Risk for inadequate nutrition/hydration (reduced following general aspiration precautions)    Diet Recommendation   dysphagia level 1(puree) w/ thin liquids -- pinched straw/monitoring straw use; aspiration precautions and swallowing strategies including verbal/tactile cues to monitor oral holding and ensure swallowing and oral clearing; reduce Distractions during meals and engage pt during po's at meal w/ self-feeding. Support w/ feeding at meals.   Medication Administration: Crushed with puree    Other  Recommendations Recommended Consults:  (Dietician f/u; Paliative Care f/u) Oral Care Recommendations: Oral care BID;Oral care before and after PO;Staff/trained caregiver to provide oral care Other Recommendations:  (n/a)    Recommendations for follow up therapy are one component of a multi-disciplinary discharge planning process, led by the attending physician.  Recommendations may be updated based on patient status, additional functional criteria and insurance authorization.  Follow up Recommendations Follow physician's recommendations for discharge plan and follow up therapies      Assistance Recommended at Discharge Frequent or constant Supervision/Assistance  Functional Status Assessment Patient has had a recent  decline in their functional status and/or demonstrates limited ability to make significant improvements in function in a reasonable and predictable amount of time  Frequency and Duration  (n/a)   (n/a)       Prognosis Prognosis for Safe Diet Advancement: Fair Barriers to Reach Goals: Cognitive deficits;Language deficits;Time post onset;Severity of deficits Barriers/Prognosis Comment: baseline Dementia; FTT      Swallow Study   General Date of Onset: 04/02/21 HPI: Pt is a pleasant 71 y.o. male with medical history significant for Dementia, hypertension, and CVA, now presenting to the emergency department after going 2 days without eating or drinking anything.  Patient lives with his son who notes that the patient had not been complaining of anything and seemed to be in his usual state but has been refusing to eat or drink for the past 2 days.  At his baseline, he requires assistance to eat or drink, needs assistance to ambulate, and is unable to have a conversation.  Family reports since his stroke in January, he has been bedbound.  They report he has had several falls out of bed over the past week. They deny any known fevers, cough, vomiting, diarrhea.  No other changes in medications.  NO CXR.  Lungs clear per MD note.  Admitting dx includes: Adult FTT. Type of Study: Bedside Swallow Evaluation Previous Swallow Assessment: none Diet Prior to this Study: NPO Temperature Spikes Noted: No (wbc WNL) Respiratory Status: Room air History of Recent Intubation: No Behavior/Cognition: Alert;Cooperative;Pleasant mood;Confused;Distractible;Requires cueing (nonverbal) Oral Cavity Assessment: Dry Oral Care Completed by SLP: Yes Oral Cavity - Dentition: Adequate natural dentition;Poor condition;Missing dentition Vision:  (  n/a) Self-Feeding Abilities: Total assist Patient Positioning: Upright in bed;Postural control adequate for testing Baseline Vocal Quality:  (n/a) Volitional Cough: Cognitively  unable to elicit Volitional Swallow: Unable to elicit    Oral/Motor/Sensory Function Overall Oral Motor/Sensory Function: Generalized oral weakness (grossly adequate) Facial Symmetry: Within Functional Limits Lingual ROM: Within Functional Limits Lingual Symmetry: Within Functional Limits Lingual Strength: Within Functional Limits   Ice Chips Ice chips: Not tested   Thin Liquid Thin Liquid: Impaired Presentation: Spoon;Straw (10 trials; 3 tsp trials) Oral Phase Impairments: Poor awareness of bolus Oral Phase Functional Implications: Prolonged oral transit;Oral residue Pharyngeal  Phase Impairments:  (none) Other Comments: oral prep and confusion w/ straw use intermittently    Nectar Thick Nectar Thick Liquid: Not tested   Honey Thick Honey Thick Liquid: Not tested   Puree Puree: Impaired Presentation: Spoon (fed; 20 trials) Oral Phase Impairments: Poor awareness of bolus Oral Phase Functional Implications: Prolonged oral transit;Oral holding Pharyngeal Phase Impairments:  (none)   Solid     Solid: Not tested        Jerilynn Som, MS, CCC-SLP Speech Language Pathologist Rehab Services; [facility] -  [phone] (ascom) Chancey Cullinane 04/03/2021,5:17 PM

## 2021-04-04 DIAGNOSIS — Z7189 Other specified counseling: Secondary | ICD-10-CM

## 2021-04-04 DIAGNOSIS — R627 Adult failure to thrive: Secondary | ICD-10-CM | POA: Diagnosis not present

## 2021-04-04 DIAGNOSIS — E43 Unspecified severe protein-calorie malnutrition: Secondary | ICD-10-CM | POA: Insufficient documentation

## 2021-04-04 LAB — BASIC METABOLIC PANEL
Anion gap: 5 (ref 5–15)
Anion gap: 4 — ABNORMAL LOW (ref 5–15)
BUN: 32 mg/dL — ABNORMAL HIGH (ref 8–23)
BUN: 30 mg/dL — ABNORMAL HIGH (ref 8–23)
CO2: 26 mmol/L (ref 22–32)
CO2: 26 mmol/L (ref 22–32)
Calcium: 7.9 mg/dL — ABNORMAL LOW (ref 8.9–10.3)
Calcium: 7.7 mg/dL — ABNORMAL LOW (ref 8.9–10.3)
Chloride: 127 mmol/L — ABNORMAL HIGH (ref 98–111)
Chloride: 123 mmol/L — ABNORMAL HIGH (ref 98–111)
Creatinine, Ser: 0.97 mg/dL (ref 0.61–1.24)
Creatinine, Ser: 1.11 mg/dL (ref 0.61–1.24)
GFR, Estimated: >60 mL/min (ref >60)
GFR, Estimated: >60 mL/min (ref >60)
Glucose, Bld: 127 mg/dL — ABNORMAL HIGH (ref 70–99)
Glucose, Bld: 106 mg/dL — ABNORMAL HIGH (ref 70–99)
Potassium: 3.4 mmol/L — ABNORMAL LOW (ref 3.5–5.1)
Potassium: 3.7 mmol/L (ref 3.5–5.1)
Sodium: 158 mmol/L — ABNORMAL HIGH (ref 135–145)
Sodium: 153 mmol/L — ABNORMAL HIGH (ref 135–145)

## 2021-04-04 LAB — GLUCOSE, CAPILLARY
Glucose-Capillary: 104 mg/dL — ABNORMAL HIGH (ref 70–99)
Glucose-Capillary: 104 mg/dL — ABNORMAL HIGH (ref 70–99)
Glucose-Capillary: 120 mg/dL — ABNORMAL HIGH (ref 70–99)
Glucose-Capillary: 121 mg/dL — ABNORMAL HIGH (ref 70–99)
Glucose-Capillary: 121 mg/dL — ABNORMAL HIGH (ref 70–99)

## 2021-04-04 MED ORDER — POTASSIUM CHLORIDE 10 MEQ/100ML IV SOLN
10.0000 meq | INTRAVENOUS | Status: AC
  Administered 2021-04-04 (×4): 10 meq via INTRAVENOUS
  Filled 2021-04-04 (×4): qty 100

## 2021-04-04 MED ORDER — ADULT MULTIVITAMIN W/MINERALS CH
1.0000 | ORAL_TABLET | Freq: Every day | ORAL | Status: DC
  Administered 2021-04-04 – 2021-04-05 (×2): 1 via ORAL
  Filled 2021-04-04 (×2): qty 1

## 2021-04-04 MED ORDER — PROSOURCE PLUS PO LIQD
30.0000 mL | Freq: Two times a day (BID) | ORAL | Status: DC
  Administered 2021-04-04 – 2021-04-05 (×3): 30 mL via ORAL
  Filled 2021-04-04 (×4): qty 30

## 2021-04-04 NOTE — Care Management Important Message (Signed)
Important Message ? ?Patient Details  ?Name: Paul Fitzgerald. ?MRN: [id number] ?Date of Birth: 1950-07-15 ? ? ?Medicare Important Message Given:  N/A - LOS <3 / Initial given by admissions ? ? ? ? ?[name] ?04/04/2021, 8:23 AM ?

## 2021-04-04 NOTE — Consult Note (Addendum)
? ?                                                                                ?Consultation Note ?Date: 04/04/2021  ? ?Patient Name: Paul Fitzgerald.  ?DOB: [birth year]  MRN: [id number]  Age / Sex: 71 y.o., male  ?PCP: Pcp, No ?Referring Physician: Sidney Ace, MD ? ?Reason for Consultation: Establishing goals of care ? ?HPI/Patient Profile: 71 y.o. male with medical history significant for dementia, hypertension, and CVA, now presenting to the emergency department after going 2 days without eating or drinking anything.  Patient lives with his son who notes that the patient had not been complaining of anything and seemed to be in his usual state but has been refusing to eat or drink for the past 2 days.  At his baseline, he requires assistance to eat or drink, needs assistance to ambulate, and is unable to have a conversation. ? ?Clinical Assessment and Goals of Care: ?Labs and notes reviewed. Patient is in bed. He appears restless and has mittens in place. He looks at me and tries to speak but is unable to. No family at bedside.  ? ?Called to speak with daughter Caryl Pina. Daughter states he has been living with her, her husband and 87 year old for the past 2 years due to dementia. She discusses his CVA in January, and his decline since.  ? ?She has been well updated and is able to articulate accurately his status and care.  ? ?We discussed his diagnoses, prognosis, GOC, EOL wishes disposition and options. ? ?A detailed discussion was had today regarding advanced directives.  Concepts specific to code status, artifical feeding and hydration, and rehospitalization were discussed.  The difference between an aggressive medical intervention path and a comfort care path was discussed.  Values and goals of care important to patient and family were attempted to be elicited. ? ?Discussed limitations of medical interventions to prolong quality of life in some situations and discussed the concept of human  mortality. ? ?She states she knows he would not be happy with his QOL since the CVA. Discussed his current status and need for mittens. Discussed his poor PO intake and PEG placement. We discussed risks and potential benefit in some cases of life prolongation. We discussed concerns and the ease in which a PEG can be removed, and that it does not preserve QOL. Discussed importance of advocating for patient and honoring his wishes if they are known.  ? ?She states she will talk to her husband. She is aware he is hospice facility or hospice at home appropriate.   ? ?SUMMARY OF RECOMMENDATIONS   ?Attending will reach out tomorrow to determine daughter's wishes. Patient is appropriate for hospice at home or facility if daughter chooses comfort route.  ? ? ?Prognosis:  ?< 2 weeks ? ?  ? ?  ? ?Primary Diagnoses: ?Present on Admission: ? Failure to thrive in adult ? Hypernatremia ? Hypertension ? AKI (acute kidney injury) ([place]) ? Dementia ([place]) ? ? ?I have reviewed the medical record, interviewed the patient and family, and examined the patient. The following aspects are pertinent. ? ?Past Medical History:  ?Diagnosis Date  ?  Hypertension   ? TIA (transient ischemic attack)   ? ?Social History  ? ?Socioeconomic History  ? Marital status: Single  ?  Spouse name: Not on file  ? Number of children: Not on file  ? Years of education: Not on file  ? Highest education level: Not on file  ?Occupational History  ? Not on file  ?Tobacco Use  ? Smoking status: Every Day  ?  Packs/day: 0.50  ?  Years: 25.00  ?  Pack years: 12.50  ?  Types: Cigarettes  ? Smokeless tobacco: Not on file  ?Substance and Sexual Activity  ? Alcohol use: Not Currently  ? Drug use: Never  ? Sexual activity: Not on file  ?Other Topics Concern  ? Not on file  ?Social History Narrative  ? ** Merged History Encounter **  ?    ? ?Social Determinants of Health  ? ?Financial Resource Strain: Not on file  ?Food Insecurity: Not on file  ?Transportation Needs: Not on  file  ?Physical Activity: Not on file  ?Stress: Not on file  ?Social Connections: Not on file  ? ?History reviewed. No pertinent family history. ?Scheduled Meds: ? (feeding supplement) PROSource Plus  30 mL Oral BID BM  ? heparin  5,000 Units Subcutaneous Q8H  ? insulin aspart  0-4 Units Subcutaneous Q4H  ? multivitamin with minerals  1 tablet Oral Daily  ? sodium chloride flush  3 mL Intravenous Q12H  ? ?Continuous Infusions: ? dextrose 100 mL/hr at 04/04/21 [id number]  ? potassium chloride 10 mEq (04/04/21 1125)  ? ?PRN Meds:.acetaminophen **OR** acetaminophen, ondansetron **OR** ondansetron (ZOFRAN) IV ?Medications Prior to Admission:  ?Prior to Admission medications   ?Medication Sig Start Date End Date Taking? Authorizing Provider  ?aspirin EC 81 MG tablet Take 81 mg by mouth at bedtime. Swallow whole.    [provider]  ?atorvastatin (LIPITOR) 40 MG tablet Take 1 tablet (40 mg total) by mouth daily. 02/10/21   Phillips Grout, MD  ?clopidogrel (PLAVIX) 75 MG tablet Take 1 tablet (75 mg total) by mouth daily. 02/10/21   Phillips Grout, MD  ? ?No Known Allergies ?Review of Systems  ?Unable to perform ROS ? ?Physical Exam ?Constitutional:   ?   Comments: Mittens in place.   ?Pulmonary:  ?   Effort: Pulmonary effort is normal.  ?Neurological:  ?   Mental Status: He is alert.  ? ? ?Vital Signs: BP (!) 92/45 (BP Location: Right Arm)   Pulse 88   Temp 97.6 ?F (36.4 ?C) (Oral)   Resp 18   Ht 5\' 6"  (1.676 m)   Wt 52.8 kg   SpO2 100%   BMI 18.79 kg/m?  ?Pain Scale: 0-10 ?  ?Pain Score: 0-No pain ? ? ?SpO2: SpO2: 100 % ?O2 Device:SpO2: 100 % ?O2 Flow Rate: .  ? ?IO: Intake/output summary:  ?Intake/Output Summary (Last 24 hours) at 04/04/2021 1221 ?Last data filed at 04/04/2021 1207 ?Gross per 24 hour  ?Intake 1742.14 ml  ?Output 750 ml  ?Net 992.14 ml  ? ? ?LBM: Last BM Date : 04/04/21 ?Baseline Weight: Weight: 54.3 kg ?Most recent weight: Weight: 52.8 kg     ? ? ?Signed by: ?Asencion Gowda, NP ?  ?Please  contact Palliative Medicine Team phone at [phone] for questions and concerns.  ?For individual provider: See Amion ? ? ? ? ? ? ? ? ? ? ? ? ? ?

## 2021-04-04 NOTE — TOC Initial Note (Signed)
Transition of Care (TOC) - Initial/Assessment Note  ? ? ?Patient Details  ?Name: Paul Fitzgerald. ?MRN: [id number] ?Date of Birth: 1950/05/30 ? ?Transition of Care (TOC) CM/SW Contact:    ?Chapman Fitch, RN ?Phone Number: ?04/04/2021, 2:41 PM ? ?Clinical Narrative:                 ? ? ?  ? Patient from home with daughter and son in law. ?Patient with dementia, failure to thrive, hx stroke ? ?Palliative has spoken with patient and discussed PEG placement vs hospice.  Attending to follow up with daughter tomorrow  ? ? ?Patient Goals and CMS Choice ?  ?  ?  ? ?Expected Discharge Plan and Services ?  ?  ?  ?  ?  ?                ?  ?  ?  ?  ?  ?  ?  ?  ?  ?  ? ?Prior Living Arrangements/Services ?  ?  ?  ?       ?  ?  ?  ?  ? ?Activities of Daily Living ?Home Assistive Devices/Equipment: None ?ADL Screening (condition at time of admission) ?Patient's cognitive ability adequate to safely complete daily activities?: No ?Is the patient deaf or have difficulty hearing?: No ?Does the patient have difficulty seeing, even when wearing glasses/contacts?: No ?Does the patient have difficulty concentrating, remembering, or making decisions?: Yes ?Patient able to express need for assistance with ADLs?: No ?Does the patient have difficulty dressing or bathing?: Yes ?Independently performs ADLs?: No ?Communication: Dependent ?Is this a change from baseline?: Pre-admission baseline ?Dressing (OT): Dependent ?Is this a change from baseline?: Pre-admission baseline ?Grooming: Dependent ?Is this a change from baseline?: Pre-admission baseline ?Feeding: Dependent ?Is this a change from baseline?: Pre-admission baseline ?Bathing: Dependent ?Is this a change from baseline?: Pre-admission baseline ?Toileting: Dependent ?Is this a change from baseline?: Pre-admission baseline ?In/Out Bed: Dependent ?Is this a change from baseline?: Pre-admission baseline ?Walks in Home: Dependent ?Is this a change from baseline?: Pre-admission baseline ?Does  the patient have difficulty walking or climbing stairs?: Yes ?Weakness of Legs: Both ?Weakness of Arms/Hands: Left ? ?Permission Sought/Granted ?  ?  ?   ?   ?   ?   ? ?Emotional Assessment ?  ?  ?  ?  ?  ?  ? ?Admission diagnosis:  Dehydration [E86.0] ?Sinus tachycardia [R00.0] ?Hypernatremia [E87.0] ?Hypoglycemia [E16.2] ?Failure to thrive in adult [R62.7] ?FTT (failure to thrive) in adult [R62.7] ?Altered mental status, unspecified altered mental status type [R41.82] ?Patient Active Problem List  ? Diagnosis Date Noted  ? Protein-calorie malnutrition, severe 04/04/2021  ? Hypernatremia 04/03/2021  ? AKI (acute kidney injury) ([facility]) 04/03/2021  ? Dementia ([facility]) 04/03/2021  ? Hypokalemia 04/03/2021  ? Hypoglycemia   ? Failure to thrive in adult 04/02/2021  ? Hypertension   ? Tobacco abuse   ? History of CVA (cerebrovascular accident) 02/07/2021  ? Acute encephalopathy 02/07/2021  ? ?PCP:  Pcp, No ?Pharmacy:   ?[id number] - [place], [place] - [address] ?[address] ?[place] [address] ?Phone: [phone] Fax: [phone] ? ? ? ? ?Social Determinants of Health (SDOH) Interventions ?  ? ?Readmission Risk Interventions ?No flowsheet data found. ? ? ?

## 2021-04-04 NOTE — Progress Notes (Signed)
Initial Nutrition Assessment ? ?DOCUMENTATION CODES:  ? ?Severe malnutrition in context of chronic illness ? ?INTERVENTION:  ? ?-Hormel Shake TID with meals, each supplement provides 520 kcals and 22 grams protein ?-30 ml Prosource Plus BID, each supplement provides 100 kcals and 15 grams protein ?-MVI with minerals daily ?-Feeding assistance with meals ? ?NUTRITION DIAGNOSIS:  ? ?Severe Malnutrition related to chronic illness (dementia) as evidenced by moderate fat depletion, severe fat depletion, moderate muscle depletion, severe muscle depletion, percent weight loss. ? ?GOAL:  ? ?Patient will meet greater than or equal to 90% of their needs ? ?MONITOR:  ? ?PO intake, Supplement acceptance, Diet advancement, Weight trends, Skin, I & O's ? ?REASON FOR ASSESSMENT:  ? ?Malnutrition Screening Tool ?  ? ?ASSESSMENT:  ? ?Paul Fitzgerald. is a pleasant 71 y.o. male with medical history significant for dementia, hypertension, and CVA, now presenting to the emergency department after going 2 days without eating or drinking anything.  Patient lives with his son who notes that the patient had not been complaining of anything and seemed to be in his usual state but has been refusing to eat or drink for the past 2 days.  At his baseline, he requires assistance to eat or drink, needs assistance to ambulate, and is unable to have a conversation. ? ?Pt admitted with AKI, FTT, and dementia.  ? ?3/9- s/p BSE- advanced to dysphagia 1 diet with thin liquids ? ?Reviewed I/O's: +1.2 L x 24 hours and +1.5 L since admission ? ?UOP: 400 ml x 24 hours ? ?Pt sitting up in bed, smiling at time of visit. He reports he got a good night's sleep. Observed breakfast tray at bedside; pt consumed 100% of eggs, pureed fruit, and orange juice. Per discussion with nurse tech, pt requires feeding assistance with meals.  ? ?Pt reports good appetite PTA, however, RD questions accuracy of this statement. Per H&P, pt was not eating for 2 days PTA.  ?   ?Reviewed wt hx; pt has experienced a 16.9% wt loss over the past 2 months, which is significant for time frame.  ? ?Palliative care team consulted for goals of care discussions.  ? ?Medications reviewed and include dextrose 10% infusion @ 100 ml/hr.  ? ?Lab Results  ?Component Value Date  ? HGBA1C 5.2 02/07/2021  ? PTA DM medications are none.  ? ?Labs reviewed: Na: 158, K: 3.4, Mg: 2.6, CBGS: 104-121 (inpatient orders for glycemic control are 0-4 units insulin aspart every 4 hours).   ? ?NUTRITION - FOCUSED PHYSICAL EXAM: ? ?Flowsheet Row Most Recent Value  ?Orbital Region Moderate depletion  ?Upper Arm Region Severe depletion  ?Thoracic and Lumbar Region Severe depletion  ?Buccal Region Moderate depletion  ?Temple Region Severe depletion  ?Clavicle Bone Region Moderate depletion  ?Clavicle and Acromion Bone Region Severe depletion  ?Scapular Bone Region Severe depletion  ?Dorsal Hand Severe depletion  ?Patellar Region Severe depletion  ?Anterior Thigh Region Severe depletion  ?Posterior Calf Region Severe depletion  ?Edema (RD Assessment) None  ?Hair Reviewed  ?Eyes Reviewed  ?Mouth Reviewed  ?Skin Reviewed  ?Nails Reviewed  ? ?  ? ? ?Diet Order:   ?Diet Order   ? ?       ?  DIET - DYS 1 Room service appropriate? Yes with Assist; Fluid consistency: Thin  Diet effective now       ?  ? ?  ?  ? ?  ? ? ?EDUCATION NEEDS:  ? ?No education needs have been  identified at this time ? ?Skin:  Skin Assessment: Reviewed RN Assessment ? ?Last BM:  04/04/21 ? ?Height:  ? ?Ht Readings from Last 1 Encounters:  ?04/03/21 5\' 6"  (1.676 m)  ? ? ?Weight:  ? ?Wt Readings from Last 1 Encounters:  ?04/04/21 52.8 kg  ? ? ?Ideal Body Weight:  64.5 kg ? ?BMI:  Body mass index is 18.79 kg/m?. ? ?Estimated Nutritional Needs:  ? ?Kcal:  1850-2050 ? ?Protein:  105-120 grams ? ?Fluid:  > 1.8 L ? ? ? ?Loistine Chance, RD, LDN, CDCES ?Registered Dietitian II ?Certified Diabetes Care and Education Specialist ?Please refer to [facility] for RD and/or RD  on-call/weekend/after hours pager  ?

## 2021-04-04 NOTE — Progress Notes (Signed)
Dr Georgeann Oppenheim wants IVF to continue and not expire. Order modified ?

## 2021-04-04 NOTE — Progress Notes (Signed)
PROGRESS NOTE    Paul Fitzgerald.  RJJ:[id number] DOB: 25-Aug-1950 DOA: 04/02/2021 PCP: Pcp, No    Brief Narrative:  71 y.o. male with medical history significant for dementia, hypertension, and CVA, now presenting to the emergency department after going 2 days without eating or drinking anything.  Patient lives with his son who notes that the patient had not been complaining of anything and seemed to be in his usual state but has been refusing to eat or drink for the past 2 days.  At his baseline, he requires assistance to eat or drink, needs assistance to ambulate, and is unable to have a conversation.  Patient with severe hypernatremia and hyperchloremia.  Started on D10 infusion at 100 cc/h on admission.  Admission sodium 168, subsequently downtrend to 167.  Per bedside RN patient also significantly constipated.  Status post digital disimpaction.  Sodium slowly downtrending.  At 158 as of 3/10   Assessment & Plan:   Principal Problem:   Failure to thrive in adult Active Problems:   History of CVA (cerebrovascular accident)   Hypertension   Hypernatremia   AKI (acute kidney injury) (HCC)   Dementia (HCC)   Protein-calorie malnutrition, severe    AKI; hypernatremia; anorexia  - Presents after not eating or drinking for 2 days and found to have AKI and sodium 168  - Unclear why he stopped eating, possibly end-stage dementia; family worried he might have had difficulty swallowing; thyroid studies normal recently  -Suspect progression of underlying dementia/cognitive decline -AKI likely prerenal -Hypernatremia indicative of severe dehydration Plan: Continue D10 infusion at 100 cc/h Renally dose all medications N.p.o. until speech evaluation Palliative care consultation requested Every 2 hours sodiums    Dementia Cognitive decline Adult failure to thrive Severe protein calorie malnutrition - Per discussion with family, at baseline, pt is disoriented and needs assistance to  eat and ambulate   - Delirium precautions   -Palliative care evaluation -DNR status   History of CVA  - No acute findings on head CT in ED  - Resume antiplatelets and statin as tolerated after SLP consult    Hypoglycemia  - CBG 45 in ED, treated with IV dextrose -On D10 infusion, continue to monitor   DVT prophylaxis: SQ heparin Code Status: DNR Family Communication: Son and daughter-in-law via phone 3/9 Disposition Plan: Status is: Inpatient Remains inpatient appropriate because: Severe hypernatremia, adult failure to thrive, poor p.o. intake   Level of care: Med-Surg  Consultants:  Palliative care  Procedures:  None  Antimicrobials: None   Subjective: Patient seen and examined.  Visibly appears frail.  Minimally responsive  Objective: Vitals:   04/03/21 1938 04/04/21 0212 04/04/21 0431 04/04/21 0753  BP: (!) 108/46 (!) 109/52  (!) 92/45  Pulse: 90 97  88  Resp: 18 18  18   Temp: 98.2 F (36.8 C) 98.5 F (36.9 C)  97.6 F (36.4 C)  TempSrc: Oral Oral  Oral  SpO2: 100% 100%  100%  Weight:   52.8 kg   Height:        Intake/Output Summary (Last 24 hours) at 04/04/2021 0959 Last data filed at 04/04/2021 0425 Gross per 24 hour  Intake 1622.14 ml  Output 400 ml  Net 1222.14 ml   Filed Weights   04/02/21 2035 04/04/21 0431  Weight: 54.3 kg 52.8 kg    Examination:  General exam: Appears frail.  Minimally responsive Respiratory system: Poor respiratory effort.  Lungs clear.  Normal work of breathing.  Room air Cardiovascular system:  Tachycardic, regular rhythm, no murmurs Gastrointestinal system: Thin, soft, NT/ND, normal bowel sounds Central nervous system: Alert, unable to assess orientation Extremities: Decreased power symmetrically.  Significant muscle wasting bilaterally Skin: No rashes, lesions or ulcers Psychiatry: Unable to assess    Data Reviewed: I have personally reviewed following labs and imaging studies  CBC: Recent Labs  Lab  04/02/21 2040 04/03/21 0435  WBC 12.3* 9.9  NEUTROABS 10.5*  --   HGB 14.9 13.4  HCT 48.6 43.2  MCV 105.4* 104.6*  PLT 151 122*   Basic Metabolic Panel: Recent Labs  Lab 04/02/21 2040 04/03/21 0435 04/03/21 1256 04/04/21 0353  NA 168* 167* 162* 158*  K 3.3* 3.4* 3.3* 3.4*  CL >130* >130* 129* 127*  CO2 GLUCOSE 157* 152* 133* 127*  BUN 55* 52* 40* 32*  CREATININE 1.40* 1.40* 1.16 0.97  CALCIUM 8.3* 8.4* 8.0* 7.9*  MG  --  2.6*  --   --   PHOS  --  2.6  --   --    GFR: Estimated Creatinine Clearance: 51.4 mL/min (by C-G formula based on SCr of 0.97 mg/dL). Liver Function Tests: Recent Labs  Lab 04/02/21 2040 04/03/21 0435  AST 46* 45*  ALT 40 39  ALKPHOS 93 95  BILITOT 1.1 1.3*  PROT 7.0 6.3*  ALBUMIN 2.7* 2.7*   No results for input(s): LIPASE, AMYLASE in the last 168 hours. No results for input(s): AMMONIA in the last 168 hours. Coagulation Profile: No results for input(s): INR, PROTIME in the last 168 hours. Cardiac Enzymes: No results for input(s): CKTOTAL, CKMB, CKMBINDEX, TROPONINI in the last 168 hours. BNP (last 3 results) No results for input(s): PROBNP in the last 8760 hours. HbA1C: No results for input(s): HGBA1C in the last 72 hours. CBG: Recent Labs  Lab 04/03/21 1656 04/03/21 1940 04/04/21 0009 04/04/21 0423 04/04/21 0751  GLUCAP 120* 104* 121* 121* 120*   Lipid Profile: No results for input(s): CHOL, HDL, LDLCALC, TRIG, CHOLHDL, LDLDIRECT in the last 72 hours. Thyroid Function Tests: No results for input(s): TSH, T4TOTAL, FREET4, T3FREE, THYROIDAB in the last 72 hours. Anemia Panel: No results for input(s): VITAMINB12, FOLATE, FERRITIN, TIBC, IRON, RETICCTPCT in the last 72 hours. Sepsis Labs: No results for input(s): PROCALCITON, LATICACIDVEN in the last 168 hours.  Recent Results (from the past 240 hour(s))  Resp Panel by RT-PCR (Flu A&B, Covid) Nasopharyngeal Swab     Status: None   Collection Time: 04/02/21 10:47  PM   Specimen: Nasopharyngeal Swab; Nasopharyngeal(NP) swabs in vial transport medium  Result Value Ref Range Status   SARS Coronavirus 2 by RT PCR NEGATIVE NEGATIVE Final    Comment: (NOTE) SARS-CoV-2 target nucleic acids are NOT DETECTED.  The SARS-CoV-2 RNA is generally detectable in upper respiratory specimens during the acute phase of infection. The lowest concentration of SARS-CoV-2 viral copies this assay can detect is 138 copies/mL. A negative result does not preclude SARS-Cov-2 infection and should not be used as the sole basis for treatment or other patient management decisions. A negative result may occur with  improper specimen collection/handling, submission of specimen other than nasopharyngeal swab, presence of viral mutation(s) within the areas targeted by this assay, and inadequate number of viral copies(<138 copies/mL). A negative result must be combined with clinical observations, patient history, and epidemiological information. The expected result is Negative.  Fact Sheet for Patients:  BloggerCourse.com  Fact Sheet for Healthcare Providers:  SeriousBroker.it  This test is no t yet approved  or cleared by the [place] FDA and  has been authorized for detection and/or diagnosis of SARS-CoV-2 by FDA under an Emergency Use Authorization (EUA). This EUA will remain  in effect (meaning this test can be used) for the duration of the COVID-19 declaration under Section 564(b)(1) of the Act, 21 U.S.C.section 360bbb-3(b)(1), unless the authorization is terminated  or revoked sooner.       Influenza A by PCR NEGATIVE NEGATIVE Final   Influenza B by PCR NEGATIVE NEGATIVE Final    Comment: (NOTE) The Xpert Xpress SARS-CoV-2/FLU/RSV plus assay is intended as an aid in the diagnosis of influenza from Nasopharyngeal swab specimens and should not be used as a sole basis for treatment. Nasal washings and aspirates are  unacceptable for Xpert Xpress SARS-CoV-2/FLU/RSV testing.  Fact Sheet for Patients: BloggerCourse.comhttps://www.fda.gov/media/152166/download  Fact Sheet for Healthcare Providers: SeriousBroker.ithttps://www.fda.gov/media/152162/download  This test is not yet approved or cleared by the [place] FDA and has been authorized for detection and/or diagnosis of SARS-CoV-2 by FDA under an Emergency Use Authorization (EUA). This EUA will remain in effect (meaning this test can be used) for the duration of the COVID-19 declaration under Section 564(b)(1) of the Act, 21 U.S.C. section 360bbb-3(b)(1), unless the authorization is terminated or revoked.  Performed at [facility], [address] [place], [place] [address]          Radiology Studies: CT HEAD WO CONTRAST (5MM)  Result Date: 04/02/2021 CLINICAL DATA:  Altered mental status. EXAM: CT HEAD WITHOUT CONTRAST TECHNIQUE: Contiguous axial images were obtained from the base of the skull through the vertex without intravenous contrast. RADIATION DOSE REDUCTION: This exam was performed according to the departmental dose-optimization program which includes automated exposure control, adjustment of the mA and/or kV according to patient size and/or use of iterative reconstruction technique. COMPARISON:  February 07, 2021 FINDINGS: Brain: There is mild cerebral atrophy with widening of the extra-axial spaces and ventricular dilatation. There are areas of decreased attenuation within the white matter tracts of the supratentorial brain, consistent with microvascular disease changes. Chronic bilateral basal ganglia lacunar infarcts are seen. Vascular: No hyperdense vessel or unexpected calcification. Skull: Normal. Negative for fracture or focal lesion. Sinuses/Orbits: No acute finding. Other: The study is limited secondary to patient motion. IMPRESSION: 1. Generalized cerebral atrophy. 2. No acute intracranial abnormality. Electronically Signed   By: Aram Candelahaddeus  Houston  M.D.   On: 04/02/2021 21:42   CT Cervical Spine Wo Contrast  Result Date: 04/02/2021 CLINICAL DATA:  Declining mental function. EXAM: CT CERVICAL SPINE WITHOUT CONTRAST TECHNIQUE: Multidetector CT imaging of the cervical spine was performed without intravenous contrast. Multiplanar CT image reconstructions were also generated. RADIATION DOSE REDUCTION: This exam was performed according to the departmental dose-optimization program which includes automated exposure control, adjustment of the mA and/or kV according to patient size and/or use of iterative reconstruction technique. COMPARISON:  None. FINDINGS: Alignment: Normal. Skull base and vertebrae: No acute fracture. A metallic density fusion plate and screws are seen along the anterior aspects of the C4, C5, C6 and C7. Soft tissues and spinal canal: No prevertebral fluid or swelling. No visible canal hematoma. Disc levels: Mild endplate sclerosis is seen at the levels of C2-C3 and C3-C4. There is moderate severity narrowing of the anterior atlantoaxial articulation. Anterior surgical fusion is seen at the levels of C4-C5, C5-C6 and C6-C7. Bilateral marked severity facet joint hypertrophy is seen at the levels of C1-C2 and C2-C3. Upper chest: Negative. Other: The study is markedly limited secondary to  patient motion. IMPRESSION: 1. Markedly limited secondary to patient motion. 2. No acute cervical spine fracture or subluxation. 3. Degenerative changes with anterior surgical fusion at the levels of C4-C5, C5-C6 and C6-C7. Electronically Signed   By: Aram Candela M.D.   On: 04/02/2021 21:47        Scheduled Meds:  (feeding supplement) PROSource Plus  30 mL Oral BID BM   heparin  5,000 Units Subcutaneous Q8H   insulin aspart  0-4 Units Subcutaneous Q4H   multivitamin with minerals  1 tablet Oral Daily   sodium chloride flush  3 mL Intravenous Q12H   Continuous Infusions:  dextrose 100 mL/hr at 04/04/21 1610   potassium chloride 10 mEq (04/04/21  0856)     LOS: 2 days      Tresa Moore, MD [facility]   If 7PM-7AM, please contact night-coverage  04/04/2021, 9:59 AM

## 2021-04-05 LAB — GLUCOSE, CAPILLARY
Glucose-Capillary: 115 mg/dL — ABNORMAL HIGH (ref 70–99)
Glucose-Capillary: 131 mg/dL — ABNORMAL HIGH (ref 70–99)
Glucose-Capillary: 90 mg/dL (ref 70–99)

## 2021-04-05 LAB — BASIC METABOLIC PANEL
Anion gap: 5 (ref 5–15)
BUN: 22 mg/dL (ref 8–23)
CO2: 25 mmol/L (ref 22–32)
Calcium: 7.8 mg/dL — ABNORMAL LOW (ref 8.9–10.3)
Chloride: 119 mmol/L — ABNORMAL HIGH (ref 98–111)
Creatinine, Ser: 0.96 mg/dL (ref 0.61–1.24)
GFR, Estimated: >60 mL/min (ref >60)
Glucose, Bld: 111 mg/dL — ABNORMAL HIGH (ref 70–99)
Potassium: 3.5 mmol/L (ref 3.5–5.1)
Sodium: 149 mmol/L — ABNORMAL HIGH (ref 135–145)

## 2021-04-05 MED ORDER — MORPHINE SULFATE (CONCENTRATE) 10 MG/0.5ML PO SOLN: 5.0000 mg | ORAL | Status: DC | PRN

## 2021-04-05 MED ORDER — POLYVINYL ALCOHOL 1.4 % OP SOLN
1.0000 [drp] | Freq: Four times a day (QID) | OPHTHALMIC | Status: DC | PRN
  Filled 2021-04-05: qty 15

## 2021-04-05 MED ORDER — LORAZEPAM 2 MG/ML IJ SOLN: 1.0000 mg | INTRAMUSCULAR | Status: DC | PRN

## 2021-04-05 MED ORDER — GLYCOPYRROLATE 0.2 MG/ML IJ SOLN
0.2000 mg | INTRAMUSCULAR | Status: DC | PRN
  Filled 2021-04-05: qty 1

## 2021-04-05 MED ORDER — DIPHENHYDRAMINE HCL 50 MG/ML IJ SOLN: 12.5000 mg | INTRAMUSCULAR | Status: DC | PRN

## 2021-04-05 MED ORDER — GLYCOPYRROLATE 1 MG PO TABS
1.0000 mg | ORAL_TABLET | ORAL | Status: DC | PRN
  Filled 2021-04-05: qty 1

## 2021-04-05 MED ORDER — LORAZEPAM 2 MG/ML PO CONC: 1.0000 mg | ORAL | Status: DC | PRN

## 2021-04-05 MED ORDER — MAGIC MOUTHWASH
15.0000 mL | Freq: Four times a day (QID) | ORAL | Status: DC | PRN
  Filled 2021-04-05: qty 20

## 2021-04-05 MED ORDER — BIOTENE DRY MOUTH MT LIQD: 15.0000 mL | OROMUCOSAL | Status: DC | PRN

## 2021-04-05 MED ORDER — LORAZEPAM 1 MG PO TABS: 1.0000 mg | ORAL_TABLET | ORAL | Status: DC | PRN

## 2021-04-05 MED ORDER — MORPHINE SULFATE (PF) 2 MG/ML IV SOLN: 1.0000 mg | INTRAVENOUS | Status: DC | PRN

## 2021-04-05 NOTE — TOC Progression Note (Signed)
Transition of Care (TOC) - Progression Note  ? ? ?Patient Details  ?Name: Paul Fitzgerald. ?MRN: [id number] ?Date of Birth: 1951/01/02 ? ?Transition of Care (TOC) CM/SW Contact  ?Bing Quarry, RN ?Phone Number: ?04/05/2021, 2:23 PM ? ?Clinical Narrative:  3/11: Hospice referral approved for residential hospice. Daughter to speak with Hospice SW regarding some issues, but there is a bed available after 9 pm this evening per hospice liaison. Gabriel Cirri RN CM   ? ? ? ?  ?  ? ?Expected Discharge Plan and Services ?  ?  ?  ?  ?  ?                ?  ?  ?  ?  ?  ?  ?  ?  ?  ?  ? ? ?Social Determinants of Health (SDOH) Interventions ?  ? ?Readmission Risk Interventions ?No flowsheet data found. ? ?

## 2021-04-05 NOTE — TOC Progression Note (Signed)
Transition of Care (TOC) - Progression Note  ? ? ?Patient Details  ?Name: Carder Yin. ?MRN: [id number] ?Date of Birth: 22-Dec-1950 ? ?Transition of Care (TOC) CM/SW Contact  ?Bing Quarry, RN ?Phone Number: ?04/05/2021, 9:44 AM ? ?Clinical Narrative:  3/11: Provider notified RN CM of daughter's choice top transition to comfort care and to contact hospice liaison. Hospice weekend liaison. Palliative following. Gabriel Cirri RN CM [phone]  ? ? ? ?  ?  ? ?Expected Discharge Plan and Services ?  ?  ?  ?  ?  ?                ?  ?  ?  ?  ?  ?  ?  ?  ?  ?  ? ? ?Social Determinants of Health (SDOH) Interventions ?  ? ?Readmission Risk Interventions ?No flowsheet data found. ? ?

## 2021-04-05 NOTE — Progress Notes (Signed)
PROGRESS NOTE    Paul Maiersonnie Schillo Jr.  AOZ:[id number] DOB: 01/29/1950 DOA: 04/02/2021 PCP: Pcp, No    Brief Narrative:  71 y.o. male with medical history significant for dementia, hypertension, and CVA, now presenting to the emergency department after going 2 days without eating or drinking anything.  Patient lives with his son who notes that the patient had not been complaining of anything and seemed to be in his usual state but has been refusing to eat or drink for the past 2 days.  At his baseline, he requires assistance to eat or drink, needs assistance to ambulate, and is unable to have a conversation.  Patient with severe hypernatremia and hyperchloremia.  Started on D10 infusion at 100 cc/h on admission.  Admission sodium 168, subsequently downtrend to 167.  Per bedside RN patient also significantly constipated.  Status post digital disimpaction.  Sodium slowly downtrending.  At 158 as of 3/10  Sodium continues to downtrend.  149 as of 3/11.  Patient status and care plan discussed at length with the family.  Palliative care consultation appreciated.  At this time family agreeable to transition to comfort measures.  TOC and hospice liaison to be engaged   Assessment & Plan:   Principal Problem:   Failure to thrive in adult Active Problems:   History of CVA (cerebrovascular accident)   Hypertension   Hypernatremia   AKI (acute kidney injury) (HCC)   Dementia (HCC)   Protein-calorie malnutrition, severe    AKI; hypernatremia; anorexia  - Presents after not eating or drinking for 2 days and found to have AKI and sodium 168  - Unclear why he stopped eating, possibly end-stage dementia; family worried he might have had difficulty swallowing; thyroid studies normal recently  -Suspect progression of underlying dementia/cognitive decline -AKI likely prerenal -Hypernatremia indicative of severe dehydration Plan: Discontinue IVF.  All care focused on patient comfort.     Dementia Cognitive decline Adult failure to thrive Severe protein calorie malnutrition - Per discussion with family, at baseline, pt is disoriented and needs assistance to eat and ambulate   - Delirium precautions   -Palliative care evaluation -DNR status -Comfort measures initiated 3/11   History of CVA  - No acute findings on head CT in ED  -Comfort measures initiated   Hypoglycemia  - CBG 45 in ED, treated with IV dextrose -Stop D10, comfort measures initiated   DVT prophylaxis: SQ heparin Code Status: DNR Family Communication: Son and daughter-in-law via phone 3/9, 3/11 Disposition Plan: Status is: Inpatient Remains inpatient appropriate because: Severe hypernatremia, adult failure to thrive, poor p.o. intake.  Comfort measures initiated 3/11.  Hospice liaison engaged   Level of care: Med-Surg  Consultants:  Palliative care  Procedures:  None  Antimicrobials: None   Subjective: Patient seen and examined.  Visibly appears frail.  Minimally responsive  Objective: Vitals:   04/04/21 1612 04/04/21 1926 04/05/21 0428 04/05/21 0500  BP: (!) 109/54 (!) 104/49 (!) 104/50   Pulse: 93 91 87   Resp:  16 16   Temp:  99.7 F (37.6 C) 98.6 F (37 C)   TempSrc:  Oral Oral   SpO2:  98% 100%   Weight:    53.6 kg  Height:        Intake/Output Summary (Last 24 hours) at 04/05/2021 0945 Last data filed at 04/05/2021 0300 Gross per 24 hour  Intake 2374.3 ml  Output 350 ml  Net 2024.3 ml   Filed Weights   04/02/21 2035 04/04/21 0431 04/05/21  0500  Weight: 54.3 kg 52.8 kg 53.6 kg    Examination:  General exam: Appears frail.  Minimally responsive Respiratory system: Poor respiratory effort.  Lungs clear.  Normal work of breathing.  Room air Cardiovascular system: Tachycardic, regular rhythm, no murmurs Gastrointestinal system: Thin, soft, NT/ND, normal bowel sounds Central nervous system: Lethargic, unable to assess orientation Extremities: Decreased power  symmetrically.  Significant muscle wasting bilaterally Skin: No rashes, lesions or ulcers Psychiatry: Unable to assess    Data Reviewed: I have personally reviewed following labs and imaging studies  CBC: Recent Labs  Lab 04/02/21 2040 04/03/21 0435  WBC 12.3* 9.9  NEUTROABS 10.5*  --   HGB 14.9 13.4  HCT 48.6 43.2  MCV 105.4* 104.6*  PLT 151 122*   Basic Metabolic Panel: Recent Labs  Lab 04/03/21 0435 04/03/21 1256 04/04/21 0353 04/04/21 1327 04/05/21 0423  NA 167* 162* 158* 153* 149*  K 3.4* 3.3* 3.4* 3.7 3.5  CL >130* 129* 127* 123* 119*  CO2 26 25 26 26 25   GLUCOSE 152* 133* 127* 106* 111*  BUN 52* 40* 32* 30* 22  CREATININE 1.40* 1.16 0.97 1.11 0.96  CALCIUM 8.4* 8.0* 7.9* 7.7* 7.8*  MG 2.6*  --   --   --   --   PHOS 2.6  --   --   --   --    GFR: Estimated Creatinine Clearance: 52.7 mL/min (by C-G formula based on SCr of 0.96 mg/dL). Liver Function Tests: Recent Labs  Lab 04/02/21 2040 04/03/21 0435  AST 46* 45*  ALT 40 39  ALKPHOS 93 95  BILITOT 1.1 1.3*  PROT 7.0 6.3*  ALBUMIN 2.7* 2.7*   No results for input(s): LIPASE, AMYLASE in the last 168 hours. No results for input(s): AMMONIA in the last 168 hours. Coagulation Profile: No results for input(s): INR, PROTIME in the last 168 hours. Cardiac Enzymes: No results for input(s): CKTOTAL, CKMB, CKMBINDEX, TROPONINI in the last 168 hours. BNP (last 3 results) No results for input(s): PROBNP in the last 8760 hours. HbA1C: No results for input(s): HGBA1C in the last 72 hours. CBG: Recent Labs  Lab 04/04/21 1207 04/04/21 1550 04/05/21 0008 04/05/21 0517 04/05/21 0914  GLUCAP 104* 104* 115* 90 131*   Lipid Profile: No results for input(s): CHOL, HDL, LDLCALC, TRIG, CHOLHDL, LDLDIRECT in the last 72 hours. Thyroid Function Tests: No results for input(s): TSH, T4TOTAL, FREET4, T3FREE, THYROIDAB in the last 72 hours. Anemia Panel: No results for input(s): VITAMINB12, FOLATE, FERRITIN, TIBC,  IRON, RETICCTPCT in the last 72 hours. Sepsis Labs: No results for input(s): PROCALCITON, LATICACIDVEN in the last 168 hours.  Recent Results (from the past 240 hour(s))  Resp Panel by RT-PCR (Flu A&B, Covid) Nasopharyngeal Swab     Status: None   Collection Time: 04/02/21 10:47 PM   Specimen: Nasopharyngeal Swab; Nasopharyngeal(NP) swabs in vial transport medium  Result Value Ref Range Status   SARS Coronavirus 2 by RT PCR NEGATIVE NEGATIVE Final    Comment: (NOTE) SARS-CoV-2 target nucleic acids are NOT DETECTED.  The SARS-CoV-2 RNA is generally detectable in upper respiratory specimens during the acute phase of infection. The lowest concentration of SARS-CoV-2 viral copies this assay can detect is 138 copies/mL. A negative result does not preclude SARS-Cov-2 infection and should not be used as the sole basis for treatment or other patient management decisions. A negative result may occur with  improper specimen collection/handling, submission of specimen other than nasopharyngeal swab, presence of viral mutation(s) within  the areas targeted by this assay, and inadequate number of viral copies(<138 copies/mL). A negative result must be combined with clinical observations, patient history, and epidemiological information. The expected result is Negative.  Fact Sheet for Patients:  BloggerCourse.com  Fact Sheet for Healthcare Providers:  SeriousBroker.it  This test is no t yet approved or cleared by the [place] FDA and  has been authorized for detection and/or diagnosis of SARS-CoV-2 by FDA under an Emergency Use Authorization (EUA). This EUA will remain  in effect (meaning this test can be used) for the duration of the COVID-19 declaration under Section 564(b)(1) of the Act, 21 U.S.C.section 360bbb-3(b)(1), unless the authorization is terminated  or revoked sooner.       Influenza A by PCR NEGATIVE NEGATIVE Final    Influenza B by PCR NEGATIVE NEGATIVE Final    Comment: (NOTE) The Xpert Xpress SARS-CoV-2/FLU/RSV plus assay is intended as an aid in the diagnosis of influenza from Nasopharyngeal swab specimens and should not be used as a sole basis for treatment. Nasal washings and aspirates are unacceptable for Xpert Xpress SARS-CoV-2/FLU/RSV testing.  Fact Sheet for Patients: BloggerCourse.com  Fact Sheet for Healthcare Providers: SeriousBroker.it  This test is not yet approved or cleared by the [place] FDA and has been authorized for detection and/or diagnosis of SARS-CoV-2 by FDA under an Emergency Use Authorization (EUA). This EUA will remain in effect (meaning this test can be used) for the duration of the COVID-19 declaration under Section 564(b)(1) of the Act, 21 U.S.C. section 360bbb-3(b)(1), unless the authorization is terminated or revoked.  Performed at [facility], [address] [place], [place] [address]          Radiology Studies: No results found.      Scheduled Meds:  insulin aspart  0-4 Units Subcutaneous Q4H   sodium chloride flush  3 mL Intravenous Q12H   Continuous Infusions:     LOS: 3 days      Tresa Moore, MD [facility]   If 7PM-7AM, please contact night-coverage  04/05/2021, 9:45 AM

## 2021-04-05 NOTE — TOC Transition Note (Signed)
Transition of Care (TOC) - CM/SW Discharge Note ? ? ?Patient Details  ?Name: Paul Fitzgerald. ?MRN: [id number] ?Date of Birth: 1950/04/05 ? ?Transition of Care (TOC) CM/SW Contact:  ?Izola Kavanagh, RN ?Phone Number: ?04/05/2021, 2:48 PM ? ? ?Clinical Narrative:   3/11: Per Venia Carbon, hospice liaison, patient will go to hospice facility after 9 pm tonight. Daughter had agreed and is aware. Venia Carbon will put a note in for who to call report to etc. [facility] will have to transport.RN CM will fill out and print  EMS forms to unit printer but PM nurse will need to call when ready. [phone] is number to the non emergency EMS transport number. Secure message to Unit RN to pass on to next shift. Simmie Davies RN CM  ? ? ? ?Final next level of care: [place] ?Barriers to Discharge: Barriers Resolved ? ? ?Patient Goals and CMS Choice ?  ?  ?Choice offered to / list presented to : Adult Children ? ?Discharge Placement ?  ?           ?  ?Patient to be transferred to facility by: [facility] ?  ?Patient and family notified of of transfer: 04/05/21 ? ?Discharge Plan and Services ?  ?  ?           ?DME Arranged: N/A ?DME Agency: NA ?  ?  ?  ?HH Arranged: NA ?[place] Agency: [facility] ?Date HH Agency Contacted: 04/05/21 ?Time [place]: [id number] spoke with at [place]: Venia Carbon ? ?Social Determinants of Health (SDOH) Interventions ?  ? ? ?Readmission Risk Interventions ?No flowsheet data found. ? ? ? ? ?

## 2021-04-05 NOTE — Progress Notes (Signed)
Civil engineer, contracting ([employer]) ? ?Referral received for end of life care at [facility]. ? ?Paul Fitzgerald is appropriate for our residential hospice facility and we can accept him tonight, after 9 pm.  ? ?Staff, please call his daughter Paul Fitzgerald, once EMS picks him up so she will know what time to expect him.  ? ?Report can be called at any time to [phone].  ? ?Thank you, ?Wallis Bamberg BSN, RN ?[facility] Area Liaison  ? ? ?

## 2021-04-05 NOTE — Progress Notes (Signed)
?  Chaplain On-Call responded to Spiritual Care Consult Order from Ralene Muskrat, MD, with the notation "Palliative Care". ? ?Chaplain attempted to visit with the patient, and found him to be non-verbal and not able to have a conversation. ? ?Chaplains will be available for additional support if requested by the family or the Physician, and will be available for the Palliative Care team if needed. ? ?Chaplain Pollyann Samples ?M.Div., BCC ?

## 2021-04-05 NOTE — Progress Notes (Signed)
Report called to [facility].  ?

## 2021-04-05 NOTE — Discharge Summary (Signed)
Physician Discharge Summary  Paul Maiersonnie Primeau Jr. WUJ:[id number] DOB: 07/13/1950 DOA: 04/02/2021  PCP: Pcp, No  Admit date: 04/02/2021 Discharge date: 04/05/2021  Admitted From: Home Disposition:  Inpatient hospice facility  Recommendations for Outpatient Follow-up:  Per outpatient hospice provider   Home Health:NA  Equipment/Devices:None   Discharge Condition:Hospice/comfort  CODE STATUS:DNR  Diet recommendation: Comfort feeds  Brief/Interim Summary: 71 y.o. male with medical history significant for dementia, hypertension, and CVA, now presenting to the emergency department after going 2 days without eating or drinking anything.  Patient lives with his son who notes that the patient had not been complaining of anything and seemed to be in his usual state but has been refusing to eat or drink for the past 2 days.  At his baseline, he requires assistance to eat or drink, needs assistance to ambulate, and is unable to have a conversation.   Patient with severe hypernatremia and hyperchloremia.  Started on D10 infusion at 100 cc/h on admission.  Admission sodium 168, subsequently downtrend to 167.  Per bedside RN patient also significantly constipated.  Status post digital disimpaction.   Sodium slowly downtrending.  At 158 as of 3/10   Sodium continues to downtrend.  149 as of 3/11.  Patient status and care plan discussed at length with the family.  Palliative care consultation appreciated.  At this time family agreeable to transition to comfort measures.  TOC and hospice liaison to be engaged  Discussed with hospice liaison Wallis BambergJennifer Woody.  Patient appropriate for disposition to inpatient hospice facility  All medications stopped on discharge.  Hospice facility to assume care and will prescribe and dose comfort medications as appropriate.     Discharge Diagnoses:  Principal Problem:   Failure to thrive in adult Active Problems:   History of CVA (cerebrovascular accident)   Hypertension    Hypernatremia   AKI (acute kidney injury) (HCC)   Dementia (HCC)   Protein-calorie malnutrition, severe  Discharged to hospice facility  Discharge Instructions  Discharge Instructions     Diet - low sodium heart healthy   Complete by: As directed    Increase activity slowly   Complete by: As directed       Allergies as of 04/05/2021   No Known Allergies      Medication List     STOP taking these medications    aspirin EC 81 MG tablet   atorvastatin 40 MG tablet Commonly known as: LIPITOR   clopidogrel 75 MG tablet Commonly known as: PLAVIX        No Known Allergies  Consultations: Palliative care   Procedures/Studies: CT HEAD WO CONTRAST (5MM)  Result Date: 04/02/2021 CLINICAL DATA:  Altered mental status. EXAM: CT HEAD WITHOUT CONTRAST TECHNIQUE: Contiguous axial images were obtained from the base of the skull through the vertex without intravenous contrast. RADIATION DOSE REDUCTION: This exam was performed according to the departmental dose-optimization program which includes automated exposure control, adjustment of the mA and/or kV according to patient size and/or use of iterative reconstruction technique. COMPARISON:  February 07, 2021 FINDINGS: Brain: There is mild cerebral atrophy with widening of the extra-axial spaces and ventricular dilatation. There are areas of decreased attenuation within the white matter tracts of the supratentorial brain, consistent with microvascular disease changes. Chronic bilateral basal ganglia lacunar infarcts are seen. Vascular: No hyperdense vessel or unexpected calcification. Skull: Normal. Negative for fracture or focal lesion. Sinuses/Orbits: No acute finding. Other: The study is limited secondary to patient motion. IMPRESSION: 1. Generalized cerebral atrophy.  2. No acute intracranial abnormality. Electronically Signed   By: Aram Candela M.D.   On: 04/02/2021 21:42   CT Cervical Spine Wo Contrast  Result Date:  04/02/2021 CLINICAL DATA:  Declining mental function. EXAM: CT CERVICAL SPINE WITHOUT CONTRAST TECHNIQUE: Multidetector CT imaging of the cervical spine was performed without intravenous contrast. Multiplanar CT image reconstructions were also generated. RADIATION DOSE REDUCTION: This exam was performed according to the departmental dose-optimization program which includes automated exposure control, adjustment of the mA and/or kV according to patient size and/or use of iterative reconstruction technique. COMPARISON:  None. FINDINGS: Alignment: Normal. Skull base and vertebrae: No acute fracture. A metallic density fusion plate and screws are seen along the anterior aspects of the C4, C5, C6 and C7. Soft tissues and spinal canal: No prevertebral fluid or swelling. No visible canal hematoma. Disc levels: Mild endplate sclerosis is seen at the levels of C2-C3 and C3-C4. There is moderate severity narrowing of the anterior atlantoaxial articulation. Anterior surgical fusion is seen at the levels of C4-C5, C5-C6 and C6-C7. Bilateral marked severity facet joint hypertrophy is seen at the levels of C1-C2 and C2-C3. Upper chest: Negative. Other: The study is markedly limited secondary to patient motion. IMPRESSION: 1. Markedly limited secondary to patient motion. 2. No acute cervical spine fracture or subluxation. 3. Degenerative changes with anterior surgical fusion at the levels of C4-C5, C5-C6 and C6-C7. Electronically Signed   By: Aram Candela M.D.   On: 04/02/2021 21:47      Subjective: Seen and examined on day of discharge.  Verbally unresponsive.  Visibly in no distress.  Discharge Exam: Vitals:   04/04/21 1926 04/05/21 0428  BP: (!) 104/49 (!) 104/50  Pulse: 91 87  Resp: 16 16  Temp: 99.7 F (37.6 C) 98.6 F (37 C)  SpO2: 98% 100%   Vitals:   04/04/21 1612 04/04/21 1926 04/05/21 0428 04/05/21 0500  BP: (!) 109/54 (!) 104/49 (!) 104/50   Pulse: 93 91 87   Resp:  16 16   Temp:  99.7 F  (37.6 C) 98.6 F (37 C)   TempSrc:  Oral Oral   SpO2:  98% 100%   Weight:    53.6 kg  Height:        General: Lethargic, frail, verbally unresponsive Cardiovascular: RRR, S1/S2 +, no rubs, no gallops Respiratory: Poor resp effort Abdominal: Very thin, non distended Extremities: Thin, substantial muscle wasting    The results of significant diagnostics from this hospitalization (including imaging, microbiology, ancillary and laboratory) are listed below for reference.     Microbiology: Recent Results (from the past 240 hour(s))  Resp Panel by RT-PCR (Flu A&B, Covid) Nasopharyngeal Swab     Status: None   Collection Time: 04/02/21 10:47 PM   Specimen: Nasopharyngeal Swab; Nasopharyngeal(NP) swabs in vial transport medium  Result Value Ref Range Status   SARS Coronavirus 2 by RT PCR NEGATIVE NEGATIVE Final    Comment: (NOTE) SARS-CoV-2 target nucleic acids are NOT DETECTED.  The SARS-CoV-2 RNA is generally detectable in upper respiratory specimens during the acute phase of infection. The lowest concentration of SARS-CoV-2 viral copies this assay can detect is 138 copies/mL. A negative result does not preclude SARS-Cov-2 infection and should not be used as the sole basis for treatment or other patient management decisions. A negative result may occur with  improper specimen collection/handling, submission of specimen other than nasopharyngeal swab, presence of viral mutation(s) within the areas targeted by this assay, and inadequate number of viral  copies(<138 copies/mL). A negative result must be combined with clinical observations, patient history, and epidemiological information. The expected result is Negative.  Fact Sheet for Patients:  BloggerCourse.com  Fact Sheet for Healthcare Providers:  SeriousBroker.it  This test is no t yet approved or cleared by the [place] FDA and  has been authorized for detection  and/or diagnosis of SARS-CoV-2 by FDA under an Emergency Use Authorization (EUA). This EUA will remain  in effect (meaning this test can be used) for the duration of the COVID-19 declaration under Section 564(b)(1) of the Act, 21 U.S.C.section 360bbb-3(b)(1), unless the authorization is terminated  or revoked sooner.       Influenza A by PCR NEGATIVE NEGATIVE Final   Influenza B by PCR NEGATIVE NEGATIVE Final    Comment: (NOTE) The Xpert Xpress SARS-CoV-2/FLU/RSV plus assay is intended as an aid in the diagnosis of influenza from Nasopharyngeal swab specimens and should not be used as a sole basis for treatment. Nasal washings and aspirates are unacceptable for Xpert Xpress SARS-CoV-2/FLU/RSV testing.  Fact Sheet for Patients: BloggerCourse.com  Fact Sheet for Healthcare Providers: SeriousBroker.it  This test is not yet approved or cleared by the [place] FDA and has been authorized for detection and/or diagnosis of SARS-CoV-2 by FDA under an Emergency Use Authorization (EUA). This EUA will remain in effect (meaning this test can be used) for the duration of the COVID-19 declaration under Section 564(b)(1) of the Act, 21 U.S.C. section 360bbb-3(b)(1), unless the authorization is terminated or revoked.  Performed at [facility], [address] [place], [place] [address]      Labs: BNP (last 3 results) No results for input(s): BNP in the last 8760 hours. Basic Metabolic Panel: Recent Labs  Lab 04/03/21 0435 04/03/21 1256 04/04/21 0353 04/04/21 1327 04/05/21 0423  NA 167* 162* 158* 153* 149*  K 3.4* 3.3* 3.4* 3.7 3.5  CL >130* 129* 127* 123* 119*  CO2 26 25 26 26 25   GLUCOSE 152* 133* 127* 106* 111*  BUN 52* 40* 32* 30* 22  CREATININE 1.40* 1.16 0.97 1.11 0.96  CALCIUM 8.4* 8.0* 7.9* 7.7* 7.8*  MG 2.6*  --   --   --   --   PHOS 2.6  --   --   --   --    Liver Function Tests: Recent Labs  Lab  04/02/21 2040 04/03/21 0435  AST 46* 45*  ALT 40 39  ALKPHOS 93 95  BILITOT 1.1 1.3*  PROT 7.0 6.3*  ALBUMIN 2.7* 2.7*   No results for input(s): LIPASE, AMYLASE in the last 168 hours. No results for input(s): AMMONIA in the last 168 hours. CBC: Recent Labs  Lab 04/02/21 2040 04/03/21 0435  WBC 12.3* 9.9  NEUTROABS 10.5*  --   HGB 14.9 13.4  HCT 48.6 43.2  MCV 105.4* 104.6*  PLT 151 122*   Cardiac Enzymes: No results for input(s): CKTOTAL, CKMB, CKMBINDEX, TROPONINI in the last 168 hours. BNP: Invalid input(s): POCBNP CBG: Recent Labs  Lab 04/04/21 1207 04/04/21 1550 04/05/21 0008 04/05/21 0517 04/05/21 0914  GLUCAP 104* 104* 115* 90 131*   D-Dimer No results for input(s): DDIMER in the last 72 hours. Hgb A1c No results for input(s): HGBA1C in the last 72 hours. Lipid Profile No results for input(s): CHOL, HDL, LDLCALC, TRIG, CHOLHDL, LDLDIRECT in the last 72 hours. Thyroid function studies No results for input(s): TSH, T4TOTAL, T3FREE, THYROIDAB in the last 72 hours.  Invalid input(s): FREET3 Anemia work up No  results for input(s): VITAMINB12, FOLATE, FERRITIN, TIBC, IRON, RETICCTPCT in the last 72 hours. Urinalysis    Component Value Date/Time   COLORURINE AMBER (A) 04/02/2021 2040   APPEARANCEUR HAZY (A) 04/02/2021 2040   LABSPEC 1.027 04/02/2021 2040   PHURINE 5.0 04/02/2021 2040   GLUCOSEU 50 (A) 04/02/2021 2040   HGBUR NEGATIVE 04/02/2021 2040   BILIRUBINUR NEGATIVE 04/02/2021 2040   KETONESUR NEGATIVE 04/02/2021 2040   PROTEINUR 30 (A) 04/02/2021 2040   NITRITE NEGATIVE 04/02/2021 2040   LEUKOCYTESUR NEGATIVE 04/02/2021 2040   Sepsis Labs Invalid input(s): PROCALCITONIN,  WBC,  LACTICIDVEN Microbiology Recent Results (from the past 240 hour(s))  Resp Panel by RT-PCR (Flu A&B, Covid) Nasopharyngeal Swab     Status: None   Collection Time: 04/02/21 10:47 PM   Specimen: Nasopharyngeal Swab; Nasopharyngeal(NP) swabs in vial transport medium   Result Value Ref Range Status   SARS Coronavirus 2 by RT PCR NEGATIVE NEGATIVE Final    Comment: (NOTE) SARS-CoV-2 target nucleic acids are NOT DETECTED.  The SARS-CoV-2 RNA is generally detectable in upper respiratory specimens during the acute phase of infection. The lowest concentration of SARS-CoV-2 viral copies this assay can detect is 138 copies/mL. A negative result does not preclude SARS-Cov-2 infection and should not be used as the sole basis for treatment or other patient management decisions. A negative result may occur with  improper specimen collection/handling, submission of specimen other than nasopharyngeal swab, presence of viral mutation(s) within the areas targeted by this assay, and inadequate number of viral copies(<138 copies/mL). A negative result must be combined with clinical observations, patient history, and epidemiological information. The expected result is Negative.  Fact Sheet for Patients:  BloggerCourse.com  Fact Sheet for Healthcare Providers:  SeriousBroker.it  This test is no t yet approved or cleared by the [place] FDA and  has been authorized for detection and/or diagnosis of SARS-CoV-2 by FDA under an Emergency Use Authorization (EUA). This EUA will remain  in effect (meaning this test can be used) for the duration of the COVID-19 declaration under Section 564(b)(1) of the Act, 21 U.S.C.section 360bbb-3(b)(1), unless the authorization is terminated  or revoked sooner.       Influenza A by PCR NEGATIVE NEGATIVE Final   Influenza B by PCR NEGATIVE NEGATIVE Final    Comment: (NOTE) The Xpert Xpress SARS-CoV-2/FLU/RSV plus assay is intended as an aid in the diagnosis of influenza from Nasopharyngeal swab specimens and should not be used as a sole basis for treatment. Nasal washings and aspirates are unacceptable for Xpert Xpress SARS-CoV-2/FLU/RSV testing.  Fact Sheet for  Patients: BloggerCourse.com  Fact Sheet for Healthcare Providers: SeriousBroker.it  This test is not yet approved or cleared by the [place] FDA and has been authorized for detection and/or diagnosis of SARS-CoV-2 by FDA under an Emergency Use Authorization (EUA). This EUA will remain in effect (meaning this test can be used) for the duration of the COVID-19 declaration under Section 564(b)(1) of the Act, 21 U.S.C. section 360bbb-3(b)(1), unless the authorization is terminated or revoked.  Performed at [facility], [address] [place], [place] [address]      Time coordinating discharge: Over 30 minutes  SIGNED:   Tresa Moore, MD  [facility] 04/05/2021, 3:46 PM Pager   If 7PM-7AM, please contact night-coverage

## 2021-04-05 NOTE — Progress Notes (Signed)
Patient cleaned of stool 2x this morning, incontinent pad changed twice. Pericare given. Changed external cath and applied a new one. Pillow under each extremity for comfort. Patient is calm and shows no signs or symptoms of distress. Will continue to monitor to end of shift. ?

## 2021-07-26 DEATH — deceased
